# Patient Record
Sex: Female | Born: 1935 | Race: White | Hispanic: No | Smoking: Former smoker
Health system: Southern US, Community
[De-identification: ages and names within clinical notes are randomized; demographics above are authoritative.]

## PROBLEM LIST (undated history)

## (undated) DIAGNOSIS — M8580 Other specified disorders of bone density and structure, unspecified site: Secondary | ICD-10-CM

## (undated) DIAGNOSIS — E785 Hyperlipidemia, unspecified: Secondary | ICD-10-CM

## (undated) DIAGNOSIS — K219 Gastro-esophageal reflux disease without esophagitis: Secondary | ICD-10-CM

## (undated) DIAGNOSIS — F419 Anxiety disorder, unspecified: Secondary | ICD-10-CM

## (undated) DIAGNOSIS — M1990 Unspecified osteoarthritis, unspecified site: Secondary | ICD-10-CM

## (undated) DIAGNOSIS — S199XXA Unspecified injury of neck, initial encounter: Secondary | ICD-10-CM

## (undated) DIAGNOSIS — G709 Myoneural disorder, unspecified: Secondary | ICD-10-CM

## (undated) DIAGNOSIS — I10 Essential (primary) hypertension: Secondary | ICD-10-CM

## (undated) DIAGNOSIS — E079 Disorder of thyroid, unspecified: Secondary | ICD-10-CM

## (undated) HISTORY — PX: BREAST LUMPECTOMY: SHX2

## (undated) HISTORY — DX: Unspecified injury of neck, initial encounter: S19.9XXA

## (undated) HISTORY — PX: TONSILLECTOMY AND ADENOIDECTOMY: SUR1326

## (undated) HISTORY — DX: Hyperlipidemia, unspecified: E78.5

## (undated) HISTORY — PX: POLYPECTOMY: SHX149

## (undated) HISTORY — PX: COLONOSCOPY: SHX174

## (undated) HISTORY — DX: Disorder of thyroid, unspecified: E07.9

## (undated) HISTORY — DX: Other specified disorders of bone density and structure, unspecified site: M85.80

## (undated) HISTORY — DX: Myoneural disorder, unspecified: G70.9

## (undated) HISTORY — DX: Gastro-esophageal reflux disease without esophagitis: K21.9

## (undated) HISTORY — DX: Unspecified osteoarthritis, unspecified site: M19.90

---

## 1969-10-10 HISTORY — PX: ABDOMINAL HYSTERECTOMY: SHX81

## 1998-01-28 ENCOUNTER — Encounter: Admission: RE | Admit: 1998-01-28 | Discharge: 1998-04-28 | Payer: Self-pay | Admitting: Family Medicine

## 1998-08-17 ENCOUNTER — Other Ambulatory Visit: Admission: RE | Admit: 1998-08-17 | Discharge: 1998-08-17 | Payer: Self-pay | Admitting: Family Medicine

## 1999-10-08 ENCOUNTER — Other Ambulatory Visit: Admission: RE | Admit: 1999-10-08 | Discharge: 1999-10-08 | Payer: Self-pay | Admitting: Family Medicine

## 2000-10-09 ENCOUNTER — Other Ambulatory Visit: Admission: RE | Admit: 2000-10-09 | Discharge: 2000-10-09 | Payer: Self-pay | Admitting: Family Medicine

## 2002-10-31 ENCOUNTER — Other Ambulatory Visit: Admission: RE | Admit: 2002-10-31 | Discharge: 2002-10-31 | Payer: Self-pay | Admitting: Family Medicine

## 2003-08-01 ENCOUNTER — Encounter: Payer: Self-pay | Admitting: Family Medicine

## 2003-08-01 ENCOUNTER — Encounter: Admission: RE | Admit: 2003-08-01 | Discharge: 2003-08-01 | Payer: Self-pay | Admitting: Family Medicine

## 2004-03-01 ENCOUNTER — Encounter: Admission: RE | Admit: 2004-03-01 | Discharge: 2004-03-01 | Payer: Self-pay | Admitting: Family Medicine

## 2004-10-15 IMAGING — CT CT ABDOMEN W/ CM
1 series · 16 of 32 positions shown, 20 images · IV contrast (GASTRO & OMNIPAQUE 100)
Comparison: none

CLINICAL DATA: Right lower quadrant pain.  
CT SCAN OF THE ABDOMEN AND PELVIS WITH CONTRAST
Multidetector helical imaging carried out through the abdomen and pelvis utilizing oral and IV contrast (100 cc Omnipaque 300).  No prior studies for comparison. 
CT ABDOMEN WITH CONTRAST 
Lung bases clear.  Liver normal except for a single lucency in the periphery of the right lobe on image number 19.  This is likely a small cyst.  No calcified gallstones or biliary dilatation.  The spleen, pancreas, and adrenals normal.  The kidneys are normal.  No adenopathy or ascites. 
IMPRESSION
Unremarkable except for a single subcentimeter lucency in the liver- likely small cyst.
CT PELVIS 
The appendix is well visualized and is normal.  No evidence for diverticulitis or ileitis.  No focal masses, adenopathy, or fluid collections. The patient appears to have had a hysterectomy.

[Series 2: routine abdomen · axial · 0.70mm/px · z∈[-383,-3]mm · 16 of 114 slices shown, 20 images]
[im 8/114  soft-tissue]
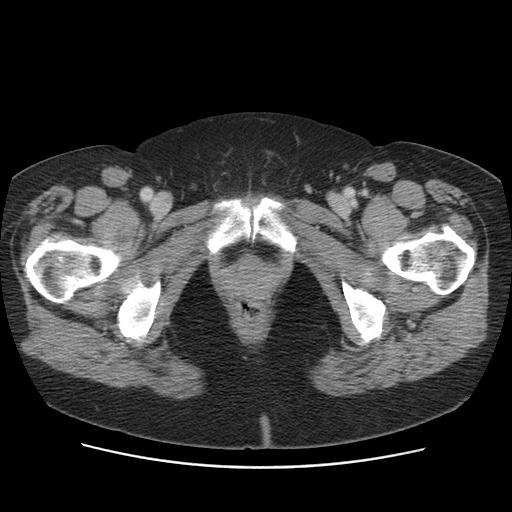
[im 8/114  bone]
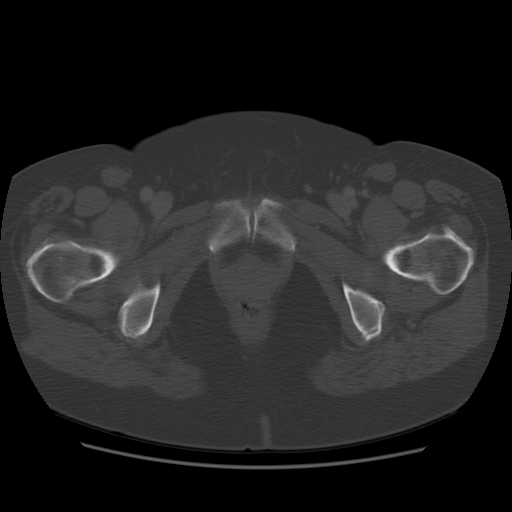
[im 15/114  soft-tissue]
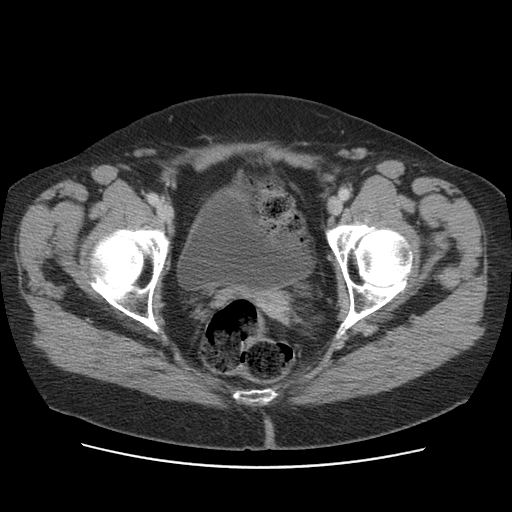
[im 22/114  soft-tissue]
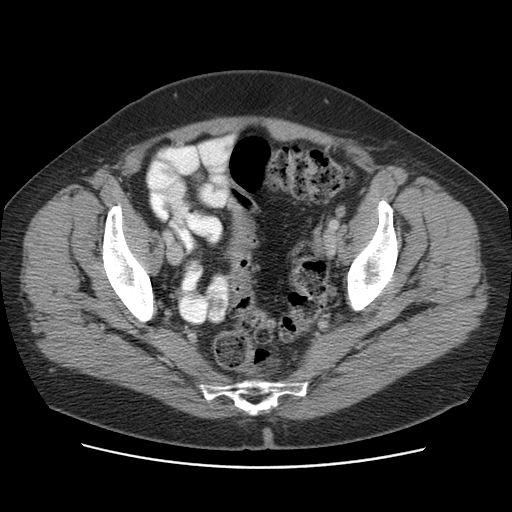
[im 30/114  soft-tissue]
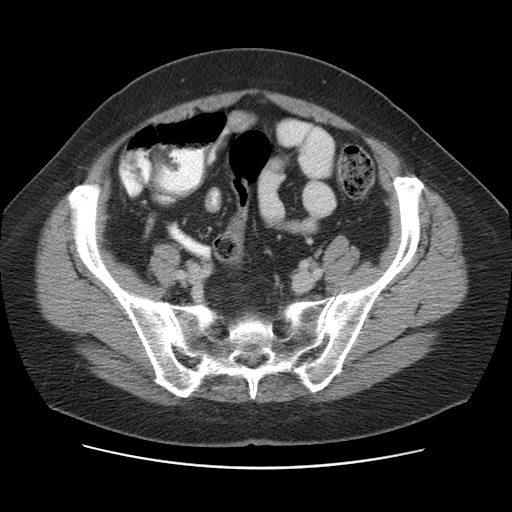
[im 37/114  soft-tissue]
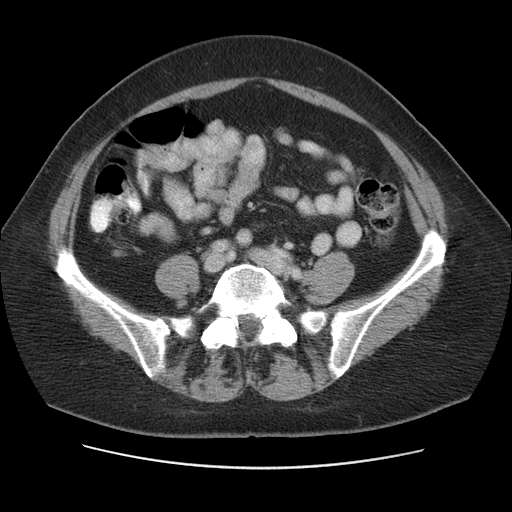
[im 44/114  soft-tissue]
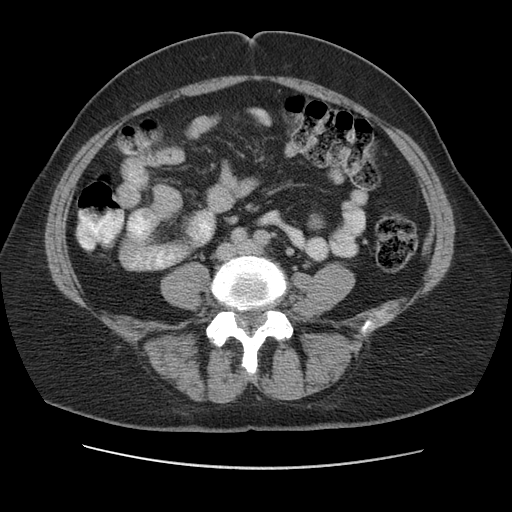
[im 52/114  soft-tissue]
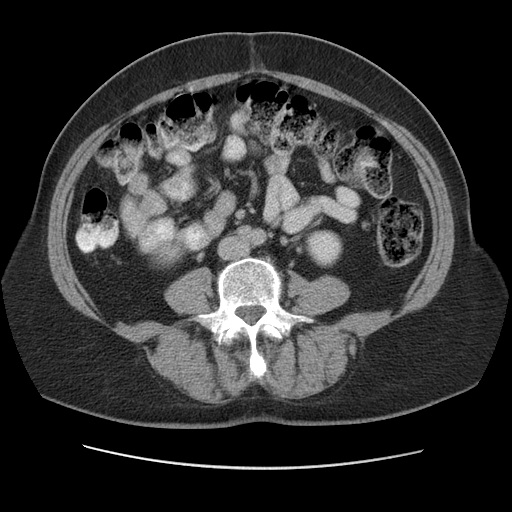
[im 62/114  soft-tissue]
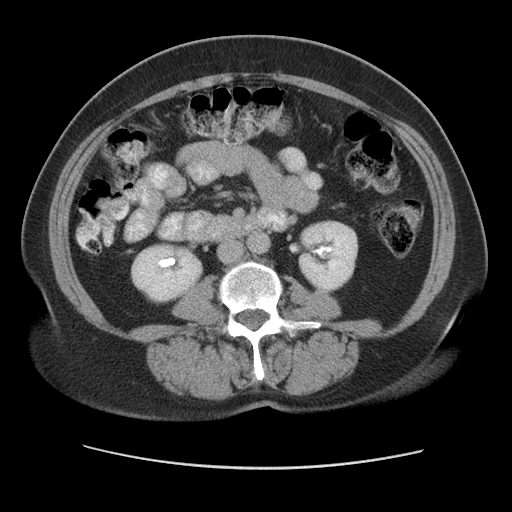
[im 70/114  soft-tissue]
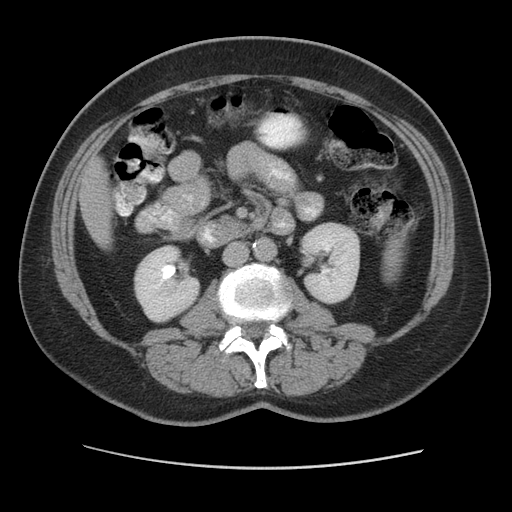
[im 70/114  bone]
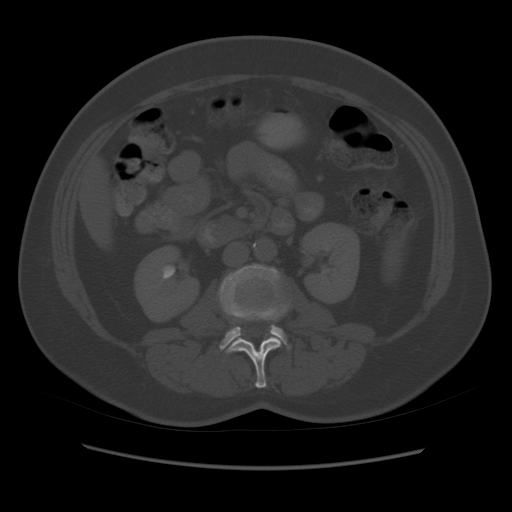
[im 77/114  soft-tissue]
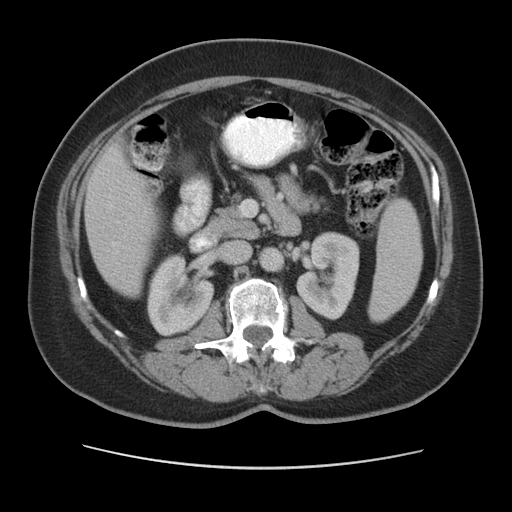
[im 84/114  soft-tissue]
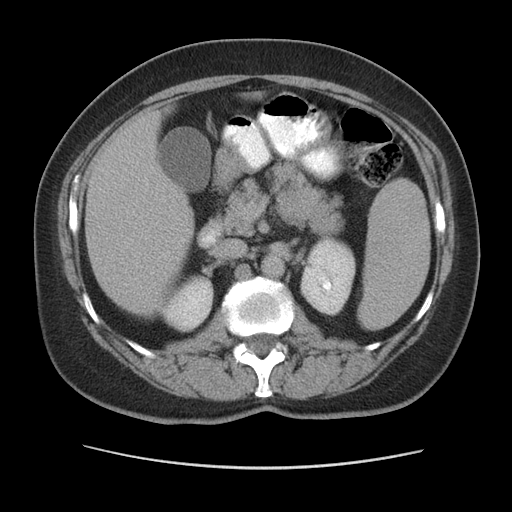
[im 92/114  soft-tissue]
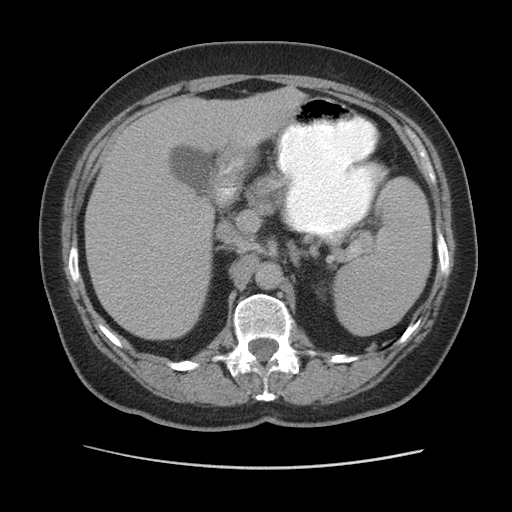
[im 99/114  soft-tissue]
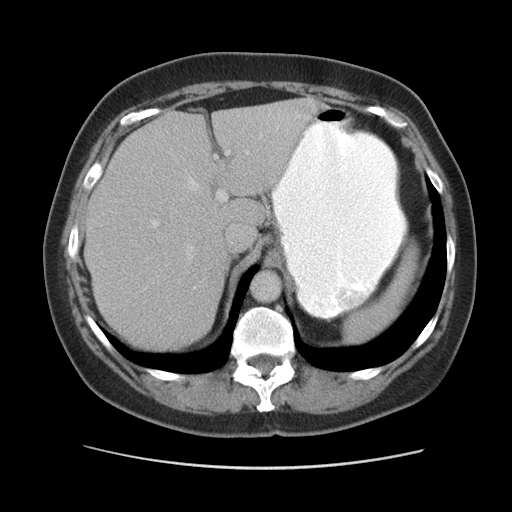
[im 99/114  lung]
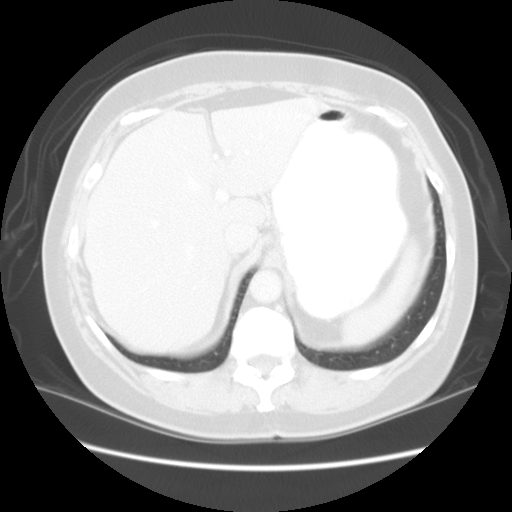
[im 103/114  lung]
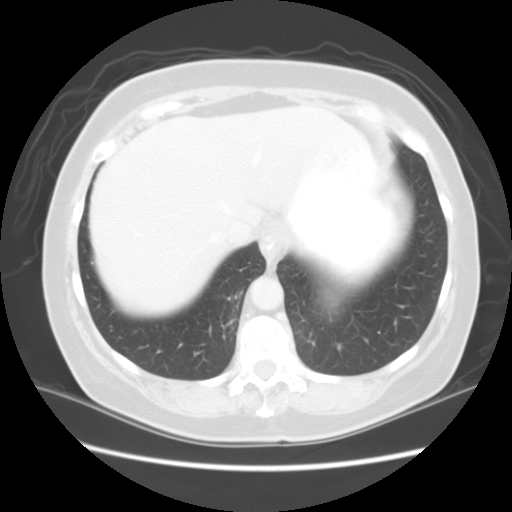
[im 106/114  soft-tissue]
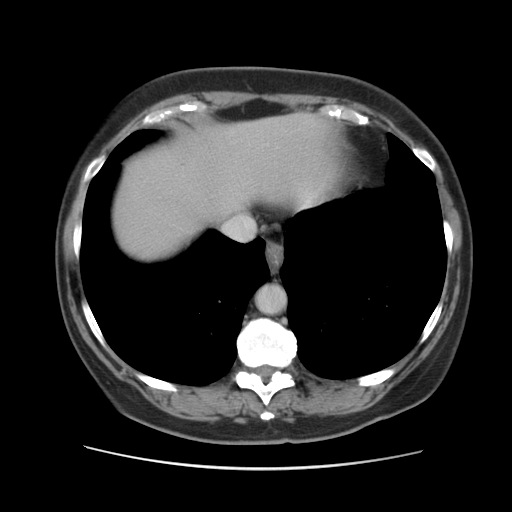
[im 106/114  lung]
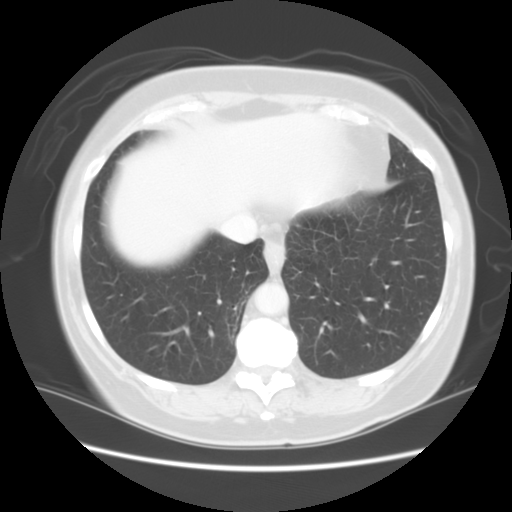
[im 110/114  lung]
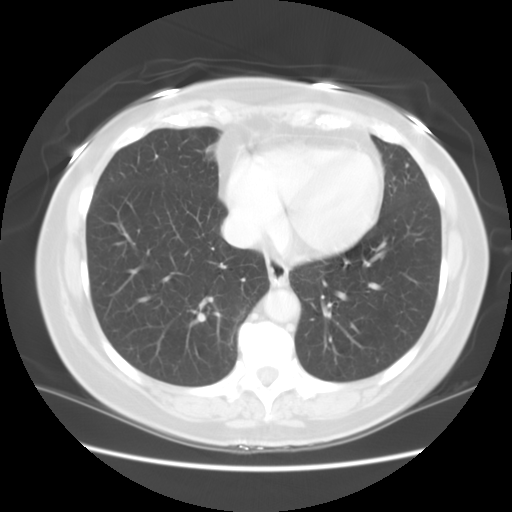

[16 of 32 positions shown; findings below may reference images not displayed]

FINDINGS: No pathological findings ? the appendix is well visualized and appears normal by CT criteria.

## 2007-01-12 ENCOUNTER — Encounter: Admission: RE | Admit: 2007-01-12 | Discharge: 2007-01-12 | Payer: Self-pay | Admitting: Family Medicine

## 2008-07-28 ENCOUNTER — Ambulatory Visit: Payer: Self-pay | Admitting: Gastroenterology

## 2008-08-06 ENCOUNTER — Telehealth: Payer: Self-pay | Admitting: Gastroenterology

## 2008-08-07 ENCOUNTER — Encounter: Payer: Self-pay | Admitting: Gastroenterology

## 2008-08-07 ENCOUNTER — Ambulatory Visit: Payer: Self-pay | Admitting: Gastroenterology

## 2008-08-08 ENCOUNTER — Encounter: Payer: Self-pay | Admitting: Gastroenterology

## 2008-08-08 ENCOUNTER — Telehealth: Payer: Self-pay | Admitting: Gastroenterology

## 2008-10-20 ENCOUNTER — Encounter: Admission: RE | Admit: 2008-10-20 | Discharge: 2008-10-20 | Payer: Self-pay | Admitting: Family Medicine

## 2010-08-24 ENCOUNTER — Emergency Department (HOSPITAL_COMMUNITY): Admission: EM | Admit: 2010-08-24 | Discharge: 2010-08-25 | Payer: Self-pay | Admitting: Emergency Medicine

## 2010-12-21 LAB — CBC
HCT: 34.4 % — ABNORMAL LOW (ref 36.0–46.0)
MCH: 31 pg (ref 26.0–34.0)
MCHC: 34.1 g/dL (ref 30.0–36.0)
MCV: 90.7 fL (ref 78.0–100.0)
RDW: 12.7 % (ref 11.5–15.5)
WBC: 4.9 10*3/uL (ref 4.0–10.5)

## 2010-12-21 LAB — POCT I-STAT, CHEM 8
BUN: 11 mg/dL (ref 6–23)
Calcium, Ion: 1.11 mmol/L — ABNORMAL LOW (ref 1.12–1.32)
Hemoglobin: 12.2 g/dL (ref 12.0–15.0)
TCO2: 28 mmol/L (ref 0–100)

## 2010-12-21 LAB — DIFFERENTIAL
Basophils Absolute: 0 10*3/uL (ref 0.0–0.1)
Eosinophils Absolute: 0.1 10*3/uL (ref 0.0–0.7)
Eosinophils Relative: 2 % (ref 0–5)
Lymphocytes Relative: 28 % (ref 12–46)
Monocytes Absolute: 0.6 10*3/uL (ref 0.1–1.0)

## 2010-12-21 LAB — URINALYSIS, ROUTINE W REFLEX MICROSCOPIC
Bilirubin Urine: NEGATIVE
Glucose, UA: NEGATIVE mg/dL
Hgb urine dipstick: NEGATIVE
Ketones, ur: NEGATIVE mg/dL
Protein, ur: NEGATIVE mg/dL
Urobilinogen, UA: 0.2 mg/dL (ref 0.0–1.0)

## 2011-08-19 ENCOUNTER — Encounter: Payer: Self-pay | Admitting: Gastroenterology

## 2011-11-03 ENCOUNTER — Encounter: Payer: Self-pay | Admitting: Gastroenterology

## 2011-11-08 ENCOUNTER — Ambulatory Visit (AMBULATORY_SURGERY_CENTER): Payer: 59 | Admitting: *Deleted

## 2011-11-08 VITALS — Ht 65.0 in | Wt 177.0 lb

## 2011-11-08 DIAGNOSIS — Z1211 Encounter for screening for malignant neoplasm of colon: Secondary | ICD-10-CM

## 2011-11-08 MED ORDER — PEG-KCL-NACL-NASULF-NA ASC-C 100 G PO SOLR: ORAL | Status: DC

## 2011-11-21 ENCOUNTER — Encounter: Payer: Self-pay | Admitting: Gastroenterology

## 2011-12-13 ENCOUNTER — Encounter: Payer: Self-pay | Admitting: Gastroenterology

## 2011-12-13 ENCOUNTER — Ambulatory Visit (AMBULATORY_SURGERY_CENTER): Payer: 59 | Admitting: Gastroenterology

## 2011-12-13 VITALS — BP 192/96 | HR 104 | Temp 96.2°F | Resp 15 | Ht 65.0 in | Wt 177.0 lb

## 2011-12-13 DIAGNOSIS — D126 Benign neoplasm of colon, unspecified: Secondary | ICD-10-CM

## 2011-12-13 DIAGNOSIS — K5730 Diverticulosis of large intestine without perforation or abscess without bleeding: Secondary | ICD-10-CM

## 2011-12-13 DIAGNOSIS — Z1211 Encounter for screening for malignant neoplasm of colon: Secondary | ICD-10-CM

## 2011-12-13 MED ORDER — SODIUM CHLORIDE 0.9 % IV SOLN: 500.0000 mL | INTRAVENOUS | Status: DC

## 2011-12-13 NOTE — Progress Notes (Signed)
Patient did not experience any of the following events: a burn prior to discharge; a fall within the facility; wrong site/side/patient/procedure/implant event; or a hospital transfer or hospital admission upon discharge from the facility. (G8907) Patient did not have preoperative order for IV antibiotic SSI prophylaxis. (G8918)  

## 2011-12-13 NOTE — Patient Instructions (Signed)
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
## 2011-12-13 NOTE — Op Note (Signed)
[facility] [address] [place], [place]  [address]  COLONOSCOPY PROCEDURE REPORT  PATIENT:  Michelle, [name]  MR#:  [id number] BIRTHDATE:  [birth year], 80 yrs. old  GENDER:  female ENDOSCOPIST:  Barbette Hair. Arlyce Dice, MD REF. BY:  Vernie Ammons, M.D. PROCEDURE DATE:  12/13/2011 PROCEDURE:  Colonoscopy with snare polypectomy, Colon with cold biopsy polypectomy ASA CLASS:  Class II INDICATIONS:  Screening, history of pre-cancerous (adenomatous) colon polyps 2012 - multiple right colon polyps MEDICATIONS:   MAC sedation, administered by CRNA propofol 200mg IV  DESCRIPTION OF PROCEDURE:   After the risks benefits and alternatives of the procedure were thoroughly explained, informed consent was obtained.  Digital rectal exam was performed and revealed no abnormalities.   The LB 180AL [id number] endoscope was introduced through the anus and advanced to the cecum, which was identified by both the appendix and ileocecal valve, without limitations.  The quality of the prep was excellent, using MoviPrep.  The instrument was then slowly withdrawn as the colon was fully examined. <<PROCEDUREIMAGES>>  FINDINGS:  Two polyps were found. 1 and 4mm sessile polyps in cecum and ascending colon, respectively, removed with cold bx forceps and cold polypectomy snare, respectively (see image4 and image6).  Severe diverticulosis was found in the sigmoid colon (see image8).  Scattered diverticula were found. transverse colon to cecum  This was otherwise a normal examination of the colon (see image2, image3, and image9).   Retroflexed views in the rectum revealed no abnormalities.    The time to cecum =  1) 6.0 minutes. The scope was then withdrawn in  1) 7.25  minutes from the cecum and the procedure completed. COMPLICATIONS:  None ENDOSCOPIC IMPRESSION: 1) Two polyps ascending colon 2) Severe diverticulosis in the sigmoid colon 3) Diverticula, scattered 4) Otherwise normal  examination RECOMMENDATIONS: 1) Given your age, you will not need another colonoscopy for colon cancer screening or polyp surveillance. These types of tests usually stop around the age 58. REPEAT EXAM:  No  ______________________________ Barbette Hair. Arlyce Dice, MD  CC:  n. eSIGNED:   Barbette Hair. Aurelia Gras at 12/13/2011 12:27 PM  Tawana Scale, [phone]

## 2011-12-14 ENCOUNTER — Telehealth: Payer: Self-pay | Admitting: *Deleted

## 2011-12-14 NOTE — Telephone Encounter (Signed)
  Follow up Call-  Call back number 12/13/2011  Post procedure Call Back phone  # [phone]  Permission to leave phone message No  comments no answering machine     Patient questions:  Do you have a fever, pain , or abdominal swelling? no Pain Score  0 *  Have you tolerated food without any problems? yes  Have you been able to return to your normal activities? yes  Do you have any questions about your discharge instructions: Diet   no Medications  no Follow up visit  no  Do you have questions or concerns about your Care? no  Actions: * If pain score is 4 or above: No action needed, pain <4.

## 2011-12-19 ENCOUNTER — Encounter: Payer: Self-pay | Admitting: Gastroenterology

## 2015-08-13 ENCOUNTER — Encounter: Payer: Self-pay | Admitting: Gastroenterology

## 2017-05-29 ENCOUNTER — Encounter (HOSPITAL_COMMUNITY): Payer: Self-pay

## 2017-05-29 ENCOUNTER — Emergency Department (HOSPITAL_COMMUNITY)
Admission: EM | Admit: 2017-05-29 | Discharge: 2017-05-29 | Disposition: A | Discharge status: Home / Self Care | Payer: Medicare Other | Attending: Emergency Medicine | Admitting: Emergency Medicine

## 2017-05-29 DIAGNOSIS — Z87891 Personal history of nicotine dependence: Secondary | ICD-10-CM | POA: Diagnosis not present

## 2017-05-29 DIAGNOSIS — Z7982 Long term (current) use of aspirin: Secondary | ICD-10-CM | POA: Insufficient documentation

## 2017-05-29 DIAGNOSIS — I160 Hypertensive urgency: Secondary | ICD-10-CM | POA: Insufficient documentation

## 2017-05-29 DIAGNOSIS — Z79899 Other long term (current) drug therapy: Secondary | ICD-10-CM | POA: Diagnosis not present

## 2017-05-29 DIAGNOSIS — E039 Hypothyroidism, unspecified: Secondary | ICD-10-CM | POA: Insufficient documentation

## 2017-05-29 DIAGNOSIS — R42 Dizziness and giddiness: Secondary | ICD-10-CM | POA: Diagnosis present

## 2017-05-29 LAB — URINALYSIS, ROUTINE W REFLEX MICROSCOPIC
BILIRUBIN URINE: NEGATIVE
Glucose, UA: NEGATIVE mg/dL
HGB URINE DIPSTICK: NEGATIVE
Ketones, ur: NEGATIVE mg/dL
Leukocytes, UA: NEGATIVE
Nitrite: NEGATIVE
PROTEIN: NEGATIVE mg/dL
Specific Gravity, Urine: 1.003 — ABNORMAL LOW (ref 1.005–1.030)
pH: 8 (ref 5.0–8.0)

## 2017-05-29 LAB — CBC WITH DIFFERENTIAL/PLATELET
Basophils Absolute: 0 10*3/uL (ref 0.0–0.1)
Basophils Relative: 0 %
EOS ABS: 0.1 10*3/uL (ref 0.0–0.7)
EOS PCT: 2 %
HCT: 43.1 % (ref 36.0–46.0)
Hemoglobin: 14.1 g/dL (ref 12.0–15.0)
LYMPHS ABS: 1.6 10*3/uL (ref 0.7–4.0)
Lymphocytes Relative: 34 %
MCH: 30 pg (ref 26.0–34.0)
MCHC: 32.7 g/dL (ref 30.0–36.0)
MCV: 91.7 fL (ref 78.0–100.0)
Monocytes Absolute: 0.4 10*3/uL (ref 0.1–1.0)
Monocytes Relative: 10 %
Neutro Abs: 2.5 10*3/uL (ref 1.7–7.7)
Neutrophils Relative %: 54 %
PLATELETS: 122 10*3/uL — AB (ref 150–400)
RBC: 4.7 MIL/uL (ref 3.87–5.11)
RDW: 12.7 % (ref 11.5–15.5)
WBC: 4.6 10*3/uL (ref 4.0–10.5)

## 2017-05-29 LAB — BASIC METABOLIC PANEL
Anion gap: 9 (ref 5–15)
BUN: 11 mg/dL (ref 6–20)
CALCIUM: 9.3 mg/dL (ref 8.9–10.3)
CHLORIDE: 106 mmol/L (ref 101–111)
CO2: 26 mmol/L (ref 22–32)
CREATININE: 0.77 mg/dL (ref 0.44–1.00)
GFR calc Af Amer: >60 mL/min (ref >60)
Glucose, Bld: 105 mg/dL — ABNORMAL HIGH (ref 65–99)
Potassium: 4.5 mmol/L (ref 3.5–5.1)
SODIUM: 141 mmol/L (ref 135–145)

## 2017-05-29 LAB — TSH: TSH: 1.617 u[IU]/mL (ref 0.350–4.500)

## 2017-05-29 LAB — T4, FREE: Free T4: 1.22 ng/dL — ABNORMAL HIGH (ref 0.61–1.12)

## 2017-05-29 MED ORDER — HYDROCHLOROTHIAZIDE 12.5 MG PO TABS: 12.5000 mg | ORAL_TABLET | Freq: Every day | ORAL | 0 refills | Status: DC
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
## 2017-05-29 NOTE — ED Triage Notes (Signed)
Pt from [facility] with high blood pressure. 210/90

## 2017-05-29 NOTE — Discharge Instructions (Signed)
As discussed, your evaluation today has been largely reassuring.  But, it is important that you monitor your condition carefully, and do not hesitate to return to the ED if you develop new, or concerning changes in your condition. ? ?Otherwise, please follow-up with your physician for appropriate ongoing care. ? ?

## 2017-05-30 ENCOUNTER — Emergency Department (HOSPITAL_COMMUNITY)
Admission: EM | Admit: 2017-05-30 | Discharge: 2017-05-30 | Disposition: A | Discharge status: Home / Self Care | Payer: Medicare Other | Attending: Emergency Medicine | Admitting: Emergency Medicine

## 2017-05-30 ENCOUNTER — Encounter (HOSPITAL_COMMUNITY): Payer: Self-pay | Admitting: Emergency Medicine

## 2017-05-30 DIAGNOSIS — E039 Hypothyroidism, unspecified: Secondary | ICD-10-CM | POA: Diagnosis not present

## 2017-05-30 DIAGNOSIS — Z7982 Long term (current) use of aspirin: Secondary | ICD-10-CM | POA: Insufficient documentation

## 2017-05-30 DIAGNOSIS — Z79899 Other long term (current) drug therapy: Secondary | ICD-10-CM | POA: Insufficient documentation

## 2017-05-30 DIAGNOSIS — Z87891 Personal history of nicotine dependence: Secondary | ICD-10-CM | POA: Insufficient documentation

## 2017-05-30 DIAGNOSIS — R42 Dizziness and giddiness: Secondary | ICD-10-CM | POA: Insufficient documentation

## 2017-05-30 DIAGNOSIS — I10 Essential (primary) hypertension: Secondary | ICD-10-CM | POA: Diagnosis not present

## 2017-05-30 MED ORDER — MECLIZINE HCL 12.5 MG PO TABS: 12.5000 mg | ORAL_TABLET | Freq: Three times a day (TID) | ORAL | 0 refills | Status: DC | PRN

## 2017-05-30 MED ORDER — HYDROCHLOROTHIAZIDE 25 MG PO TABS: 25.0000 mg | ORAL_TABLET | Freq: Every day | ORAL | 0 refills | Status: DC

## 2017-05-30 NOTE — ED Triage Notes (Addendum)
Pt in from [facility] via [facility] with HTN, dizziness and near syncope at 1510 while resting this afternoon. Came here yesterday for HTN, BP was 240/90 for EMS en route today. Pt denies pain, HA, n/v. Pt states she started new BP med yesterday. MAE's equally, CBG 122, sats 95% RA, a&ox4
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
## 2017-05-30 NOTE — ED Notes (Signed)
Attempted to call [facility]. Will continue to call to give report.

## 2018-02-12 ENCOUNTER — Emergency Department (HOSPITAL_COMMUNITY): Payer: Medicare Other

## 2018-02-12 ENCOUNTER — Emergency Department (HOSPITAL_COMMUNITY)
Admission: EM | Admit: 2018-02-12 | Discharge: 2018-02-12 | Disposition: A | Discharge status: Home / Self Care | Payer: Medicare Other | Attending: Emergency Medicine | Admitting: Emergency Medicine

## 2018-02-12 ENCOUNTER — Other Ambulatory Visit: Payer: Self-pay

## 2018-02-12 ENCOUNTER — Encounter (HOSPITAL_COMMUNITY): Payer: Self-pay | Admitting: Obstetrics and Gynecology

## 2018-02-12 DIAGNOSIS — W1830XA Fall on same level, unspecified, initial encounter: Secondary | ICD-10-CM | POA: Insufficient documentation

## 2018-02-12 DIAGNOSIS — X500XXA Overexertion from strenuous movement or load, initial encounter: Secondary | ICD-10-CM | POA: Diagnosis not present

## 2018-02-12 DIAGNOSIS — Y999 Unspecified external cause status: Secondary | ICD-10-CM | POA: Insufficient documentation

## 2018-02-12 DIAGNOSIS — Z79899 Other long term (current) drug therapy: Secondary | ICD-10-CM | POA: Insufficient documentation

## 2018-02-12 DIAGNOSIS — Y9389 Activity, other specified: Secondary | ICD-10-CM | POA: Diagnosis not present

## 2018-02-12 DIAGNOSIS — Y92099 Unspecified place in other non-institutional residence as the place of occurrence of the external cause: Secondary | ICD-10-CM | POA: Diagnosis not present

## 2018-02-12 DIAGNOSIS — S99912A Unspecified injury of left ankle, initial encounter: Secondary | ICD-10-CM | POA: Diagnosis present

## 2018-02-12 DIAGNOSIS — I10 Essential (primary) hypertension: Secondary | ICD-10-CM | POA: Diagnosis not present

## 2018-02-12 DIAGNOSIS — M25572 Pain in left ankle and joints of left foot: Secondary | ICD-10-CM | POA: Diagnosis not present

## 2018-02-12 HISTORY — DX: Essential (primary) hypertension: I10

## 2018-02-12 MED ORDER — ACETAMINOPHEN 500 MG PO TABS: 500.0000 mg | ORAL_TABLET | Freq: Four times a day (QID) | ORAL | 0 refills | Status: DC | PRN

## 2018-02-12 NOTE — ED Notes (Signed)
Bed: WTR5 Expected date:  Expected time:  Means of arrival:  Comments: EMS-ankle pain/twisted
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
## 2018-02-12 NOTE — Discharge Instructions (Signed)
Take Tylenol every 6 hours for pain. Keep your leg elevated. Apply ice to your ankle as frequently as possible. Use the walking boot and your walker to get around. Follow-up with orthopedic doctor for further evaluation of your ankle. Return to the emergency room if you cannot feel your foot, pain worsens significantly, or any new or concerning symptoms.

## 2018-02-12 NOTE — ED Triage Notes (Signed)
Pt coming from home BIB EMS.Pt has bruising on the ankle. PT did not fall or hit her head. No LOC reported.  Pt was standing from her walker and stood up from her seat and twisted her ankle. Pt currently has ice on it. Pt has a hx of hypertension.

## 2018-02-12 NOTE — ED Notes (Signed)
RN called [facility] who informed RN that they have cab vouchers for [employer] to get patients back. RN double checked and told the taxi personnel that cab voucher would be provided from [facility] and the taxi driver was given the number. Pt was not appropriate to go back by [facility] as she was able to walk with her Cam walker, had no AMS, and had no oxygen dependencies.  Representative from [place] was made aware of the impending discharge and aware pt is coming and cab voucher expected upon arrival.

## 2018-04-30 ENCOUNTER — Emergency Department (HOSPITAL_COMMUNITY)
Admission: EM | Admit: 2018-04-30 | Discharge: 2018-04-30 | Disposition: A | Discharge status: Home / Self Care | Payer: Medicare Other | Attending: Emergency Medicine | Admitting: Emergency Medicine

## 2018-04-30 ENCOUNTER — Encounter (HOSPITAL_COMMUNITY): Payer: Self-pay

## 2018-04-30 DIAGNOSIS — Z79899 Other long term (current) drug therapy: Secondary | ICD-10-CM | POA: Insufficient documentation

## 2018-04-30 DIAGNOSIS — Z87891 Personal history of nicotine dependence: Secondary | ICD-10-CM | POA: Insufficient documentation

## 2018-04-30 DIAGNOSIS — Z7982 Long term (current) use of aspirin: Secondary | ICD-10-CM | POA: Diagnosis not present

## 2018-04-30 DIAGNOSIS — E039 Hypothyroidism, unspecified: Secondary | ICD-10-CM | POA: Diagnosis not present

## 2018-04-30 DIAGNOSIS — R319 Hematuria, unspecified: Secondary | ICD-10-CM | POA: Diagnosis present

## 2018-04-30 DIAGNOSIS — I10 Essential (primary) hypertension: Secondary | ICD-10-CM | POA: Insufficient documentation

## 2018-04-30 DIAGNOSIS — N3001 Acute cystitis with hematuria: Secondary | ICD-10-CM | POA: Diagnosis not present

## 2018-04-30 LAB — URINALYSIS, ROUTINE W REFLEX MICROSCOPIC
BILIRUBIN URINE: NEGATIVE
Glucose, UA: NEGATIVE mg/dL
Ketones, ur: NEGATIVE mg/dL
NITRITE: NEGATIVE
PH: 7 (ref 5.0–8.0)
Protein, ur: 100 mg/dL — AB
SPECIFIC GRAVITY, URINE: 1.016 (ref 1.005–1.030)

## 2018-04-30 MED ORDER — PHENAZOPYRIDINE HCL 200 MG PO TABS: 200.0000 mg | ORAL_TABLET | Freq: Three times a day (TID) | ORAL | 0 refills | Status: DC

## 2018-04-30 MED ORDER — CEPHALEXIN 500 MG PO CAPS
500.0000 mg | ORAL_CAPSULE | Freq: Once | ORAL | Status: AC
  Administered 2018-04-30: 500 mg via ORAL
  Filled 2018-04-30: qty 1

## 2018-04-30 MED ORDER — CEPHALEXIN 500 MG PO CAPS: 500.0000 mg | ORAL_CAPSULE | Freq: Four times a day (QID) | ORAL | 0 refills | Status: DC

## 2018-04-30 MED ORDER — HYDROCHLOROTHIAZIDE 12.5 MG PO CAPS
25.0000 mg | ORAL_CAPSULE | Freq: Once | ORAL | Status: AC
  Administered 2018-04-30: 25 mg via ORAL
  Filled 2018-04-30: qty 2

## 2018-04-30 MED ORDER — HYDROCHLOROTHIAZIDE 25 MG PO TABS: 25.0000 mg | ORAL_TABLET | Freq: Every day | ORAL | Status: DC

## 2018-04-30 NOTE — ED Notes (Signed)
Bed: [facility] Expected date:  Expected time:  Means of arrival:  Comments: Hold for 22

## 2018-04-30 NOTE — ED Notes (Signed)
Bed: BS49 Expected date:  Expected time:  Means of arrival:  Comments: 84yo F hematuria

## 2018-04-30 NOTE — ED Triage Notes (Addendum)
Patient arrived via [facility] from [facility]. Patient voided this morning and when she wiped she had pink tint on toilet paper and few clots. Patient was initially hypertensive 210/100 and dropped to 150/100 on ride over to ED. Patient was anxious and feels this is why her bp was high initially. Patient states she does not feel her BP medication is working. Hx. Vertigo and anxiety.

## 2018-04-30 NOTE — ED Notes (Addendum)
Spoke with [facility]. Nurse was unavailable. Left a message asking RN to call back and that patient will be returning home in a taxi.

## 2018-04-30 NOTE — Discharge Instructions (Addendum)
It was our pleasure to provide your ER care today - we hope that you feel better.  Drink plenty of fluids. Take keflex (antibiotic) as prescribed. Take ibuprofen or acetaminophen as need.   Follow up with primary care doctor in 1 week if symptoms fail to improve/resolve. Also follow up with your doctor for recheck of blood pressure in the next 1-2 weeks.   Return to ER if worse, new symptoms, new or severe pain, persistent vomiting, other concern.
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
## 2018-05-02 LAB — URINE CULTURE: Culture: >=100000 — AB

## 2018-05-03 ENCOUNTER — Telehealth: Payer: Self-pay | Admitting: Emergency Medicine

## 2018-05-03 NOTE — Telephone Encounter (Signed)
Post ED Visit - Positive Culture Follow-up  Culture report reviewed by antimicrobial stewardship pharmacist:  []  Elenor Quinones, Pharm.D. []  Heide Guile, Pharm.D., BCPS AQ-ID []  Parks Neptune, Pharm.D., BCPS []  Alycia Rossetti, Pharm.D., BCPS []  Hester, Pharm.D., BCPS, AAHIVP []  Legrand Como, Pharm.D., BCPS, AAHIVP [x]  Salome Arnt, PharmD, BCPS []  Johnnette Gourd, PharmD, BCPS []  Hughes Better, PharmD, BCPS []  Leeroy Cha, PharmD  Positive urine culture Treated with cephalexin, organism sensitive to the same and no further patient follow-up is required at this time.  Hazle Nordmann 05/03/2018, 5:08 PM

## 2018-05-24 ENCOUNTER — Encounter (INDEPENDENT_AMBULATORY_CARE_PROVIDER_SITE_OTHER): Payer: Medicare Other | Admitting: Ophthalmology

## 2018-05-24 DIAGNOSIS — I10 Essential (primary) hypertension: Secondary | ICD-10-CM | POA: Diagnosis not present

## 2018-05-24 DIAGNOSIS — H43813 Vitreous degeneration, bilateral: Secondary | ICD-10-CM | POA: Diagnosis not present

## 2018-05-24 DIAGNOSIS — H35033 Hypertensive retinopathy, bilateral: Secondary | ICD-10-CM

## 2018-05-24 DIAGNOSIS — H353132 Nonexudative age-related macular degeneration, bilateral, intermediate dry stage: Secondary | ICD-10-CM | POA: Diagnosis not present

## 2018-09-28 IMAGING — CR DG ANKLE COMPLETE 3+V*L*
3 series · 3 of 3 positions shown · non-contrast
Comparison: None.

CLINICAL DATA: 87-year-old who injured the LEFT ankle when standing
up from her chair using her walker earlier this evening. Patient now
complains of hip and pelvis pain. Initial encounter.

EXAM:
LEFT ANKLE COMPLETE - 3+ VIEW

[x ankle ap left]
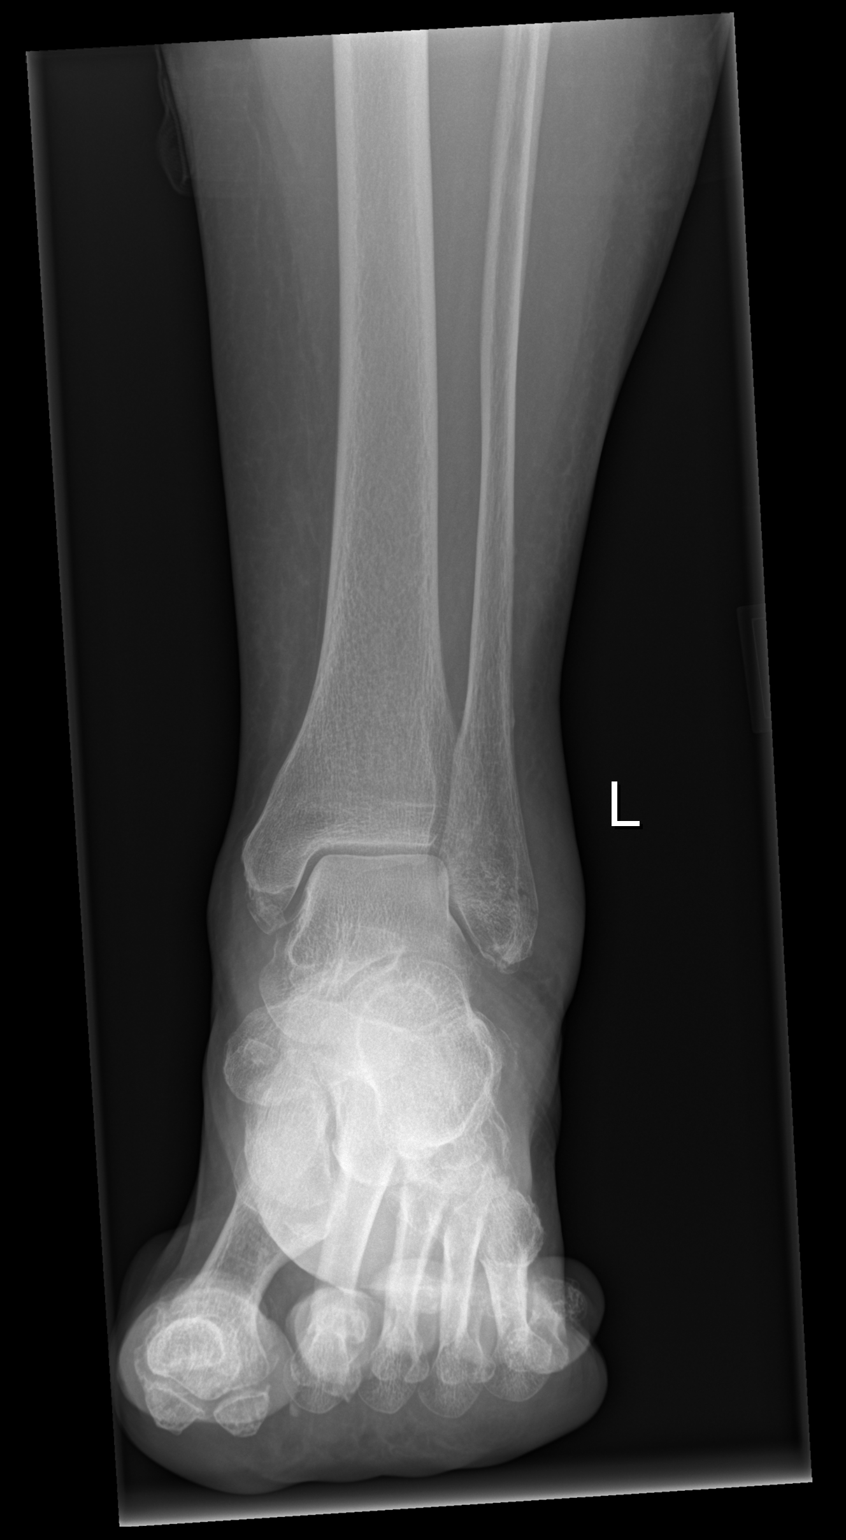

[x ankle obl left]
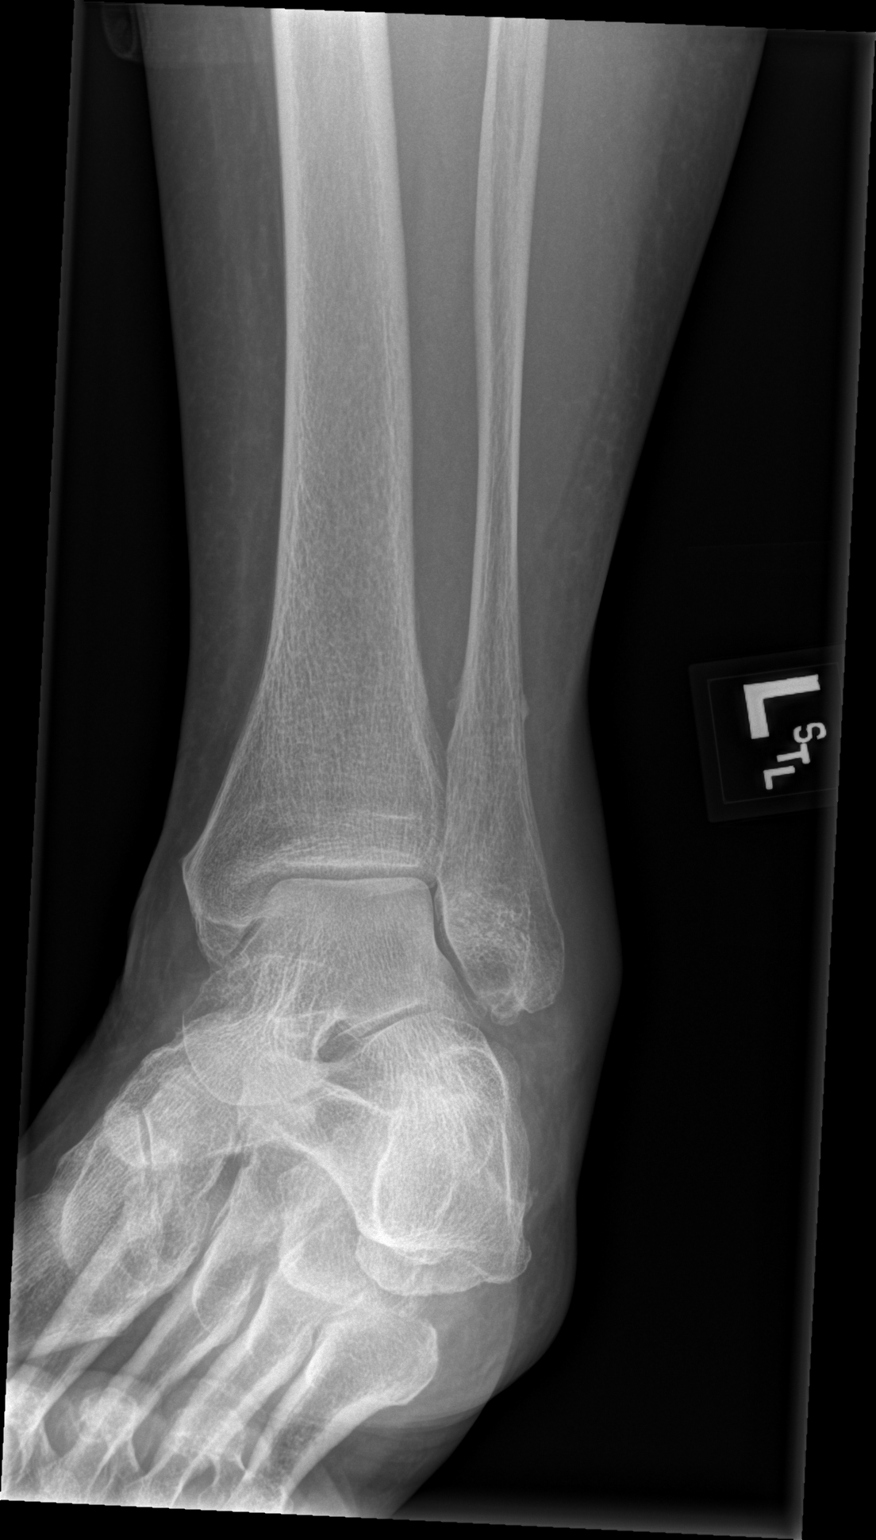

[x ankle lat left]
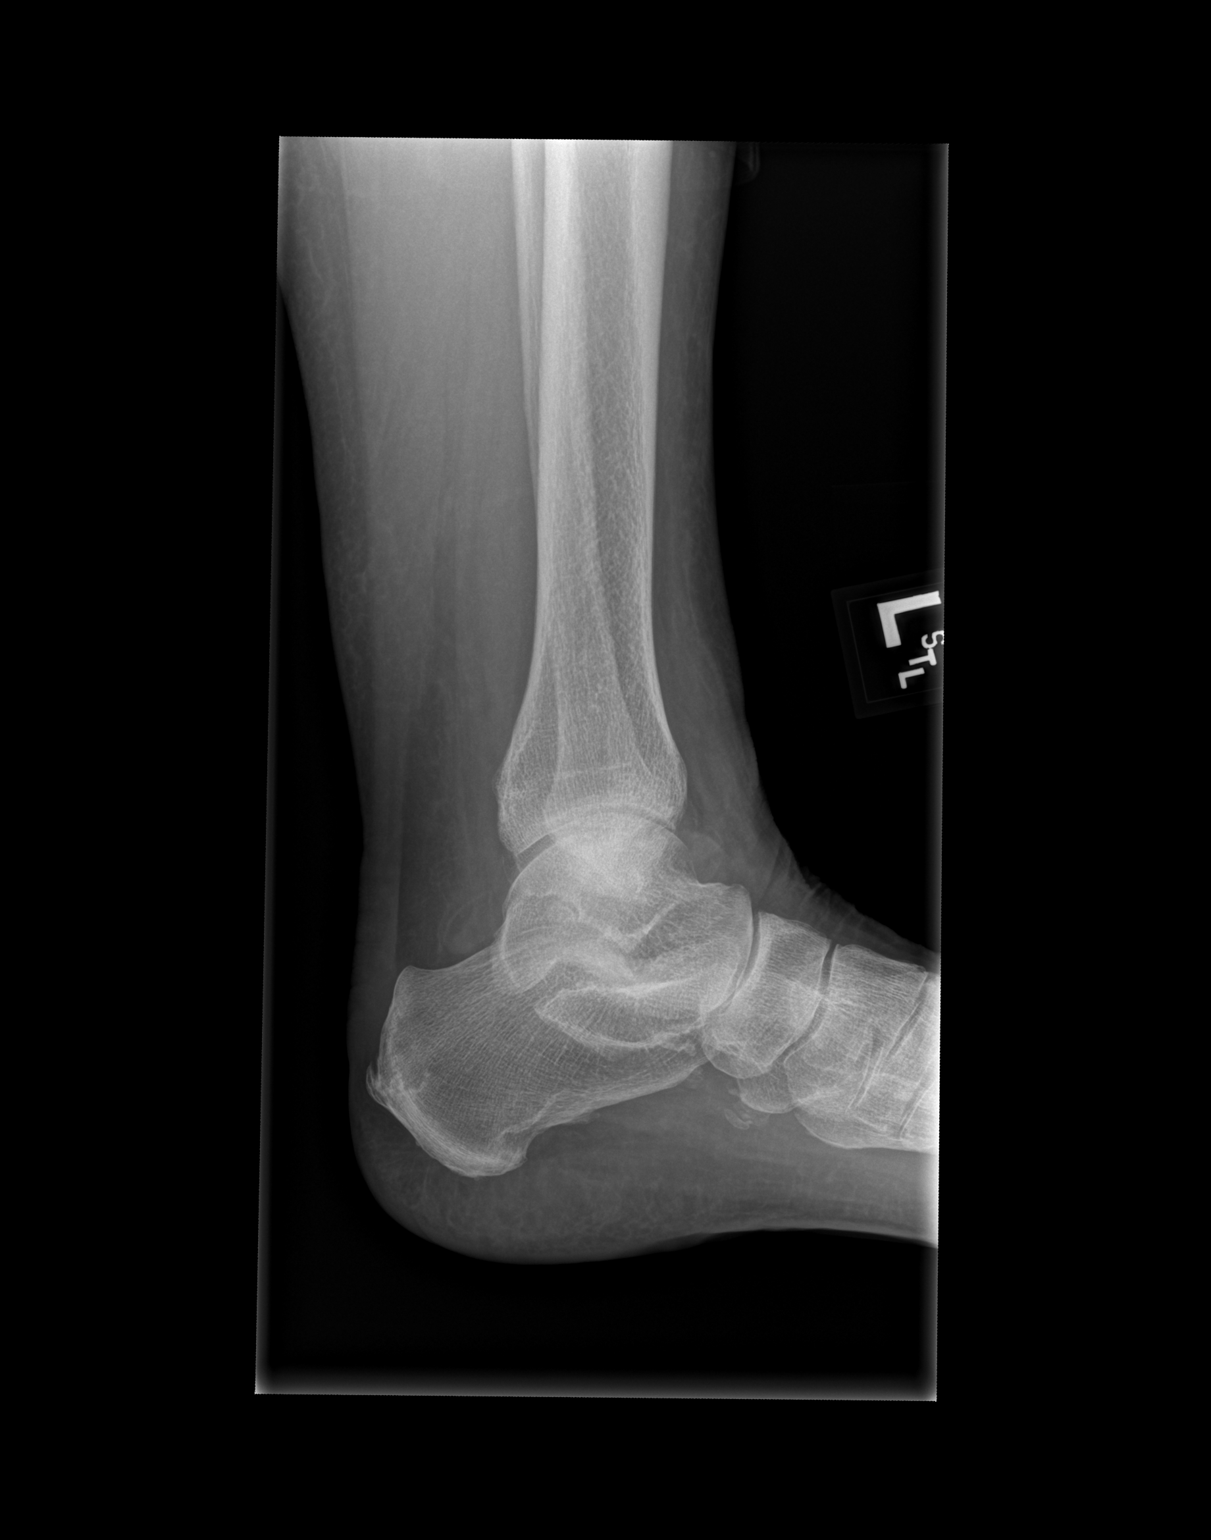

[3 of 3 positions shown; findings below may reference images not displayed]

FINDINGS: LATERAL soft tissue swelling. Possible avulsion fracture arising
from the LATERAL aspect of the calcaneus, visible only on the AP
image. No other fractures. Ankle mortise intact with mild joint
space narrowing. Large ankle joint effusion.
IMPRESSION: 1. Possible avulsion fracture arising from the LATERAL aspect of the
calcaneus. Please correlate with point tenderness.
2. No fractures elsewhere.
3. Osteoarthritis.

## 2020-01-15 ENCOUNTER — Other Ambulatory Visit: Payer: Self-pay | Admitting: Radiology

## 2020-01-17 ENCOUNTER — Encounter: Payer: Self-pay | Admitting: *Deleted

## 2020-01-23 ENCOUNTER — Encounter: Payer: Self-pay | Admitting: *Deleted

## 2020-01-23 DIAGNOSIS — Z170 Estrogen receptor positive status [ER+]: Secondary | ICD-10-CM | POA: Insufficient documentation

## 2020-01-23 DIAGNOSIS — C50212 Malignant neoplasm of upper-inner quadrant of left female breast: Secondary | ICD-10-CM | POA: Insufficient documentation
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
## 2020-01-29 ENCOUNTER — Inpatient Hospital Stay: Payer: Medicare Other | Attending: Oncology | Admitting: Oncology

## 2020-01-29 ENCOUNTER — Ambulatory Visit: Payer: Medicare Other | Attending: Surgery | Admitting: Physical Therapy

## 2020-01-29 ENCOUNTER — Encounter: Payer: Self-pay | Admitting: Licensed Clinical Social Worker

## 2020-01-29 ENCOUNTER — Other Ambulatory Visit: Payer: Self-pay | Admitting: *Deleted

## 2020-01-29 ENCOUNTER — Ambulatory Visit: Payer: Self-pay | Admitting: Surgery

## 2020-01-29 ENCOUNTER — Encounter: Payer: Self-pay | Admitting: Physical Therapy

## 2020-01-29 ENCOUNTER — Inpatient Hospital Stay: Payer: Medicare Other

## 2020-01-29 ENCOUNTER — Other Ambulatory Visit: Payer: Self-pay

## 2020-01-29 ENCOUNTER — Ambulatory Visit
Admission: RE | Admit: 2020-01-29 | Discharge: 2020-01-29 | Disposition: A | Discharge status: Home / Self Care | Payer: Medicare Other | Source: Ambulatory Visit | Attending: Radiation Oncology | Admitting: Radiation Oncology

## 2020-01-29 ENCOUNTER — Encounter: Payer: Self-pay | Admitting: *Deleted

## 2020-01-29 ENCOUNTER — Encounter: Payer: Self-pay | Admitting: Radiation Oncology

## 2020-01-29 VITALS — BP 163/70 | HR 79 | Temp 98.2°F | Resp 18 | Ht 65.0 in | Wt 162.2 lb

## 2020-01-29 DIAGNOSIS — C50212 Malignant neoplasm of upper-inner quadrant of left female breast: Secondary | ICD-10-CM | POA: Insufficient documentation

## 2020-01-29 DIAGNOSIS — R293 Abnormal posture: Secondary | ICD-10-CM | POA: Diagnosis present

## 2020-01-29 DIAGNOSIS — Z170 Estrogen receptor positive status [ER+]: Secondary | ICD-10-CM | POA: Diagnosis not present

## 2020-01-29 DIAGNOSIS — Z79899 Other long term (current) drug therapy: Secondary | ICD-10-CM

## 2020-01-29 DIAGNOSIS — C50912 Malignant neoplasm of unspecified site of left female breast: Secondary | ICD-10-CM

## 2020-01-29 DIAGNOSIS — Z87891 Personal history of nicotine dependence: Secondary | ICD-10-CM

## 2020-01-29 DIAGNOSIS — M1990 Unspecified osteoarthritis, unspecified site: Secondary | ICD-10-CM

## 2020-01-29 DIAGNOSIS — R410 Disorientation, unspecified: Secondary | ICD-10-CM | POA: Diagnosis not present

## 2020-01-29 LAB — CMP (CANCER CENTER ONLY)
ALT: 16 U/L (ref 0–44)
AST: 20 U/L (ref 15–41)
Albumin: 3.8 g/dL (ref 3.5–5.0)
Alkaline Phosphatase: 62 U/L (ref 38–126)
Anion gap: 9 (ref 5–15)
BUN: 11 mg/dL (ref 8–23)
CO2: 27 mmol/L (ref 22–32)
Calcium: 9.4 mg/dL (ref 8.9–10.3)
Chloride: 104 mmol/L (ref 98–111)
Creatinine: 0.93 mg/dL (ref 0.44–1.00)
GFR, Est AFR Am: >60 mL/min (ref >60)
GFR, Estimated: 55 mL/min — ABNORMAL LOW (ref >60)
Glucose, Bld: 119 mg/dL — ABNORMAL HIGH (ref 70–99)
Potassium: 4.1 mmol/L (ref 3.5–5.1)
Sodium: 140 mmol/L (ref 135–145)
Total Bilirubin: 0.5 mg/dL (ref 0.3–1.2)
Total Protein: 6.9 g/dL (ref 6.5–8.1)

## 2020-01-29 LAB — CBC WITH DIFFERENTIAL (CANCER CENTER ONLY)
Abs Immature Granulocytes: 0.01 10*3/uL (ref 0.00–0.07)
Basophils Absolute: 0 10*3/uL (ref 0.0–0.1)
Basophils Relative: 0 %
Eosinophils Absolute: 0.1 10*3/uL (ref 0.0–0.5)
Eosinophils Relative: 2 %
HCT: 43.3 % (ref 36.0–46.0)
Hemoglobin: 14 g/dL (ref 12.0–15.0)
Immature Granulocytes: 0 %
Lymphocytes Relative: 28 %
Lymphs Abs: 1.4 10*3/uL (ref 0.7–4.0)
MCH: 30.4 pg (ref 26.0–34.0)
MCHC: 32.3 g/dL (ref 30.0–36.0)
MCV: 94.1 fL (ref 80.0–100.0)
Monocytes Absolute: 0.5 10*3/uL (ref 0.1–1.0)
Monocytes Relative: 10 %
Neutro Abs: 3 10*3/uL (ref 1.7–7.7)
Neutrophils Relative %: 60 %
Platelet Count: 155 10*3/uL (ref 150–400)
RBC: 4.6 MIL/uL (ref 3.87–5.11)
RDW: 12.4 % (ref 11.5–15.5)
WBC Count: 5.1 10*3/uL (ref 4.0–10.5)
nRBC: 0 % (ref 0.0–0.2)

## 2020-01-29 LAB — GENETIC SCREENING ORDER

## 2020-01-29 NOTE — Progress Notes (Signed)
START OFF PATHWAY REGIMEN - Breast   [id number]:Trastuzumab (Maintenance - NO Loading Dose):   A cycle is every 21 days:     Trastuzumab-xxxx   **Always confirm dose/schedule in your pharmacy ordering system**  Patient Characteristics: Postoperative without Neoadjuvant Therapy (Pathologic Staging), Invasive Disease, Extended Adjuvant Therapy Indicated (Following Completion of Trastuzumab-based Adjuvant Therapy), HER2 Positive, ER Positive Therapeutic Status: Postoperative without Neoadjuvant Therapy (Pathologic Staging) AJCC Grade: GX AJCC N Category: pNX AJCC M Category: cM0 ER Status: Positive (+) AJCC 8 Stage Grouping: IIA HER2 Status: Positive (+) Oncotype Dx Recurrence Score: Not Appropriate AJCC T Category: pTX PR Status: Positive (+) Intent of Therapy: Curative Intent, Discussed with Patient

## 2020-01-29 NOTE — H&P (View-Only) (Signed)
Michelle [name] Documented: 01/29/2020 8:16 AM Location: [facility] Patient #: [id number] DOB: [birth year] Undefined / Language: Michelle [name] / Race: White Female  History of Present Illness Michelle Moores A. Alfred Eckley MD; 01/29/2020 2:50 PM) Patient words: Pt seen [facility] for left breast cancer triple positive 1. 6 cm and second area CSL in [place]. No hx of mass discharge or breast pain. Sore from biopsy and overall in good health.  The patient is a 86 year old female.   Past Surgical History Michelle Slipper, RN; 01/29/2020 8:16 AM) Breast Biopsy Left. Hysterectomy (due to cancer) - Partial Tonsillectomy  Diagnostic Studies History Michelle Slipper, RN; 01/29/2020 8:16 AM) Colonoscopy 5-10 years ago Mammogram within last year Pap Smear >5 years ago  Medication History Michelle Slipper, RN; 01/29/2020 8:16 AM) Medications Reconciled  Social History Michelle Slipper, RN; 01/29/2020 8:16 AM) Alcohol use Occasional alcohol use. Caffeine use Coffee, Tea. No drug use Tobacco use Former smoker.  Family History Michelle Slipper, RN; 01/29/2020 8:16 AM) Alcohol Abuse Father, Sister. Arthritis Mother. Cerebrovascular Accident Mother. Hypertension Mother.  Pregnancy / Birth History Michelle Slipper, RN; 01/29/2020 8:16 AM) Age at menarche 12 years. Age of menopause 51-55 Contraceptive History Oral contraceptives. Gravida 1 Maternal age 48-25 Para 3  Other Problems Michelle Slipper, RN; 01/29/2020 8:16 AM) Gastroesophageal Reflux Disease High blood pressure Melanoma Thyroid Disease     Review of Systems (Michelle Savona A. Robbie Nangle MD; 01/29/2020 2:50 PM) General Not Present- Appetite Loss, Chills, Fatigue, Fever, Night Sweats, Weight Gain and Weight Loss. Skin Present- Rash. Not Present- Change in Wart/Mole, Dryness, Hives, Jaundice, New Lesions, Non-Healing Wounds and Ulcer. HEENT Not Present- Earache, Hearing Loss, Hoarseness, Nose Bleed, Oral Ulcers, Ringing in the Ears, Seasonal Allergies, Sinus  Pain, Sore Throat, Visual Disturbances, Wears glasses/contact lenses and Yellow Eyes. Respiratory Not Present- Bloody sputum, Chronic Cough, Difficulty Breathing, Snoring and Wheezing. Breast Not Present- Breast Mass, Breast Pain, Nipple Discharge and Skin Changes. Cardiovascular Present- Leg Cramps. Not Present- Chest Pain, Difficulty Breathing Lying Down, Palpitations, Rapid Heart Rate, Shortness of Breath and Swelling of Extremities. Gastrointestinal Present- Indigestion. Not Present- Abdominal Pain, Bloating, Bloody Stool, Change in Bowel Habits, Chronic diarrhea, Constipation, Difficulty Swallowing, Excessive gas, Gets full quickly at meals, Hemorrhoids, Nausea, Rectal Pain and Vomiting. Female Genitourinary Not Present- Frequency, Nocturia, Painful Urination, Pelvic Pain and Urgency. Musculoskeletal Not Present- Back Pain, Joint Pain, Joint Stiffness, Muscle Pain, Muscle Weakness and Swelling of Extremities. Neurological Not Present- Decreased Memory, Fainting, Headaches, Numbness, Seizures, Tingling, Tremor, Trouble walking and Weakness. Psychiatric Not Present- Anxiety, Bipolar, Change in Sleep Pattern, Depression, Fearful and Frequent crying. Endocrine Not Present- Cold Intolerance, Excessive Hunger, Hair Changes, Heat Intolerance, Hot flashes and New Diabetes. Hematology Not Present- Blood Thinners, Easy Bruising, Excessive bleeding, Gland problems, HIV and Persistent Infections. All other systems negative   Physical Exam (Michelle Wiederholt A. Yaremi Stahlman MD; 01/29/2020 2:53 PM)  General Mental Status-Alert. General Appearance-Consistent with stated age. Hydration-Well hydrated. Voice-Normal.  Head and Neck Head-normocephalic, atraumatic with no lesions or palpable masses. Trachea-midline. Thyroid Gland Characteristics - normal size and consistency.  Chest and Lung Exam Chest and lung exam reveals -quiet, even and easy respiratory effort with no use of accessory muscles and on  auscultation, normal breath sounds, no adventitious sounds and normal vocal resonance. Inspection Chest Wall - Normal. Back - normal.  Breast Note: BRUISING LEFT BREAST UOQ RIGHT BREAST NORMAL  Cardiovascular Cardiovascular examination reveals -normal heart sounds, regular rate and rhythm with no murmurs and normal pedal pulses bilaterally.  Neurologic Neurologic evaluation  reveals -alert and oriented x 3 with no impairment of recent or remote memory. Mental Status-Normal.  Musculoskeletal Normal Exam - Left-Upper Extremity Strength Normal and Lower Extremity Strength Normal. Normal Exam - Right-Upper Extremity Strength Normal and Lower Extremity Strength Normal.  Lymphatic Head & Neck  General Head & Neck Lymphatics: Bilateral - Description - Normal. Axillary  General Axillary Region: Bilateral - Description - Normal. Tenderness - Non Tender.    Assessment & Plan (Michelle Buss A. Nianna Igo MD; 01/29/2020 2:57 PM)  BREAST CANCER, LEFT (C50.912) Impression: PT OPTED FOR LEFT BREAST LUMPECTOMY AND LEFT SLN MAPPING WILL NEED PORT AS WELL  Risk of lumpectomy include bleeding, infection, seroma, more surgery, use of seed/wire, wound care, cosmetic deformity and the need for other treatments, death , blood clots, death. Pt agrees to proceed. Risk of sentinel lymph node mapping include bleeding, infection, lymphedema, shoulder pain. stiffness, dye allergy. cosmetic deformity , blood clots, death, need for more surgery. Pt agrees to proceed.  Pt requires port placement for chemotherapy. Risk include bleeding, infection, pneumothorax, hemothorax, mediastinal injury, nerve injury , blood vessel injury, stroke, blood clots, death, migration. embolization and need for additional procedures. Pt agrees to proceed.   45 min total time for exam chart review documented  Current Plans You are being scheduled for surgery- Our schedulers will call you.  You should hear from our office's  scheduling department within 5 working days about the location, date, and time of surgery. We try to make accommodations for patient's preferences in scheduling surgery, but sometimes the OR schedule or the surgeon's schedule prevents [place] from making those accommodations.  If you have not heard from our office [phone]) in 5 working days, call the office and ask for your surgeon's nurse.  If you have other questions about your diagnosis, plan, or surgery, call the office and ask for your surgeon's nurse.  Pt Education - [facility] Breast Cancer Information Given - [facility] "Breast Journey" Package We discussed the staging and pathophysiology of breast cancer. We discussed all of the different options for treatment for breast cancer including surgery, chemotherapy, radiation therapy, Herceptin, and antiestrogen therapy. We discussed a sentinel lymph node biopsy as she does not appear to having lymph node involvement right now. We discussed the performance of that with injection of radioactive tracer and blue dye. We discussed that she would have an incision underneath her axillary hairline. We discussed that there is a bout a 10-20% chance of having a positive node with a sentinel lymph node biopsy and we will await the permanent pathology to make any other first further decisions in terms of her treatment. One of these options might be to return to the operating room to perform an axillary lymph node dissection. We discussed about a 1-2% risk lifetime of chronic shoulder pain as well as lymphedema associated with a sentinel lymph node biopsy. We discussed the options for treatment of the breast cancer which included lumpectomy versus a mastectomy. We discussed the performance of the lumpectomy with a wire placement. We discussed a 10-20% chance of a positive margin requiring reexcision in the operating room. We also discussed that she may need radiation therapy or antiestrogen therapy or both if she undergoes  lumpectomy. We discussed the mastectomy and the postoperative care for that as well. We discussed that there is no difference in her survival whether she undergoes lumpectomy with radiation therapy or antiestrogen therapy versus a mastectomy. There is a slight difference in the local recurrence rate being 3-5% with lumpectomy  and about 1% with a mastectomy. We discussed the risks of operation including bleeding, infection, possible reoperation. She understands her further therapy will be based on what her stages at the time of her operation.  Pt Education - [facility] breast cancer surgery: discussed with patient and provided information. Pt Education - [facility] Breast Biopsy HCI: discussed with patient and provided information. Use of a central venous catheter for intravenous therapy was discussed. Technique of catheter placement using ultrasound and fluoroscopy guidance was discussed. Risks such as bleeding, infection, pneumothorax, catheter occlusion, reoperation, and other risks were discussed. I noted a good likelihood this will help address the problem. Questions were answered. The patient expressed understanding & wishes to proceed.

## 2020-01-29 NOTE — H&P (Signed)
Michelle [name] Documented: 01/29/2020 8:16 AM Location: [facility] Patient #: [id number] DOB: 1931/05/24 Undefined / Language: Michelle [name] / Race: White Female  History of Present Illness Michelle [name]; 01/29/2020 2:50 PM) Patient words: Pt seen [place] for left breast cancer triple positive 1. 6 cm and second area CSL in [place]. No hx of mass discharge or breast pain. Sore from biopsy and overall in good health.  The patient is a 86 year old female.   Past Surgical History Michelle Slipper, Michelle [name]; 01/29/2020 8:16 AM) Breast Biopsy Left. Hysterectomy (due to cancer) - Partial Tonsillectomy  Diagnostic Studies History Michelle Slipper, Michelle [name]; 01/29/2020 8:16 AM) Colonoscopy 5-10 years ago Mammogram within last year Pap Smear >5 years ago  Medication History Michelle Slipper, Michelle [name]; 01/29/2020 8:16 AM) Medications Reconciled  Social History Michelle Slipper, Michelle [name]; 01/29/2020 8:16 AM) Alcohol use Occasional alcohol use. Caffeine use Coffee, Tea. No drug use Tobacco use Former smoker.  Family History Michelle Slipper, Michelle [name]; 01/29/2020 8:16 AM) Alcohol Abuse Father, Sister. Arthritis Mother. Cerebrovascular Accident Mother. Hypertension Mother.  Pregnancy / Birth History Michelle Slipper, Michelle [name]; 01/29/2020 8:16 AM) Age at menarche 51 years. Age of menopause 51-55 Contraceptive History Oral contraceptives. Gravida 1 Maternal age 47-25 Para 17  Other Problems Michelle Slipper, Michelle [name]; 01/29/2020 8:16 AM) Gastroesophageal Reflux Disease High blood pressure Melanoma Thyroid Disease     Review of Systems (Michelle [name]; 01/29/2020 2:50 PM) General Not Present- Appetite Loss, Chills, Fatigue, Fever, Night Sweats, Weight Gain and Weight Loss. Skin Present- Rash. Not Present- Change in Wart/Mole, Dryness, Hives, Jaundice, New Lesions, Non-Healing Wounds and Ulcer. HEENT Not Present- Earache, Hearing Loss, Hoarseness, Nose Bleed, Oral Ulcers, Ringing in the Ears, Seasonal Allergies, Sinus  Pain, Sore Throat, Visual Disturbances, Wears glasses/contact lenses and Yellow Eyes. Respiratory Not Present- Bloody sputum, Chronic Cough, Difficulty Breathing, Snoring and Wheezing. Breast Not Present- Breast Mass, Breast Pain, Nipple Discharge and Skin Changes. Cardiovascular Present- Leg Cramps. Not Present- Chest Pain, Difficulty Breathing Lying Down, Palpitations, Rapid Heart Rate, Shortness of Breath and Swelling of Extremities. Gastrointestinal Present- Indigestion. Not Present- Abdominal Pain, Bloating, Bloody Stool, Change in Bowel Habits, Chronic diarrhea, Constipation, Difficulty Swallowing, Excessive gas, Gets full quickly at meals, Hemorrhoids, Nausea, Rectal Pain and Vomiting. Female Genitourinary Not Present- Frequency, Nocturia, Painful Urination, Pelvic Pain and Urgency. Musculoskeletal Not Present- Back Pain, Joint Pain, Joint Stiffness, Muscle Pain, Muscle Weakness and Swelling of Extremities. Neurological Not Present- Decreased Memory, Fainting, Headaches, Numbness, Seizures, Tingling, Tremor, Trouble walking and Weakness. Psychiatric Not Present- Anxiety, Bipolar, Change in Sleep Pattern, Depression, Fearful and Frequent crying. Endocrine Not Present- Cold Intolerance, Excessive Hunger, Hair Changes, Heat Intolerance, Hot flashes and New Diabetes. Hematology Not Present- Blood Thinners, Easy Bruising, Excessive bleeding, Gland problems, HIV and Persistent Infections. All other systems negative   Physical Exam (Michelle [name]; 01/29/2020 2:53 PM)  General Mental Status-Alert. General Appearance-Consistent with stated age. Hydration-Well hydrated. Voice-Normal.  Head and Neck Head-normocephalic, atraumatic with no lesions or palpable masses. Trachea-midline. Thyroid Gland Characteristics - normal size and consistency.  Chest and Lung Exam Chest and lung exam reveals -quiet, even and easy respiratory effort with no use of accessory muscles and on  auscultation, normal breath sounds, no adventitious sounds and normal vocal resonance. Inspection Chest Wall - Normal. Back - normal.  Breast Note: BRUISING LEFT BREAST UOQ RIGHT BREAST NORMAL  Cardiovascular Cardiovascular examination reveals -normal heart sounds, regular rate and rhythm with no murmurs and normal pedal pulses bilaterally.  Neurologic Neurologic evaluation  reveals -alert and oriented x 3 with no impairment of recent or remote memory. Mental Status-Normal.  Musculoskeletal Normal Exam - Left-Upper Extremity Strength Normal and Lower Extremity Strength Normal. Normal Exam - Right-Upper Extremity Strength Normal and Lower Extremity Strength Normal.  Lymphatic Head & Neck  General Head & Neck Lymphatics: Bilateral - Description - Normal. Axillary  General Axillary Region: Bilateral - Description - Normal. Tenderness - Non Tender.    Assessment & Plan (Michelle [name]; 01/29/2020 2:57 PM)  BREAST CANCER, LEFT (C50.912) Impression: PT OPTED FOR LEFT BREAST LUMPECTOMY AND LEFT SLN MAPPING WILL NEED PORT AS WELL  Risk of lumpectomy include bleeding, infection, seroma, more surgery, use of seed/wire, wound care, cosmetic deformity and the need for other treatments, death , blood clots, death. Pt agrees to proceed. Risk of sentinel lymph node mapping include bleeding, infection, lymphedema, shoulder pain. stiffness, dye allergy. cosmetic deformity , blood clots, death, need for more surgery. Pt agrees to proceed.  Pt requires port placement for chemotherapy. Risk include bleeding, infection, pneumothorax, hemothorax, mediastinal injury, nerve injury , blood vessel injury, stroke, blood clots, death, migration. embolization and need for additional procedures. Pt agrees to proceed.   45 min total time for exam chart review documented  Current Plans You are being scheduled for surgery- Our schedulers will call you.  You should hear from our office's  scheduling department within 5 working days about the location, date, and time of surgery. We try to make accommodations for patient's preferences in scheduling surgery, but sometimes the OR schedule or the surgeon's schedule prevents [place] from making those accommodations.  If you have not heard from our office [phone]) in 5 working days, call the office and ask for your surgeon's nurse.  If you have other questions about your diagnosis, plan, or surgery, call the office and ask for your surgeon's nurse.  Pt Education - [facility] Breast Cancer Information Given - [facility] "Breast Journey" Package We discussed the staging and pathophysiology of breast cancer. We discussed all of the different options for treatment for breast cancer including surgery, chemotherapy, radiation therapy, Herceptin, and antiestrogen therapy. We discussed a sentinel lymph node biopsy as she does not appear to having lymph node involvement right now. We discussed the performance of that with injection of radioactive tracer and blue dye. We discussed that she would have an incision underneath her axillary hairline. We discussed that there is a bout a 10-20% chance of having a positive node with a sentinel lymph node biopsy and we will await the permanent pathology to make any other first further decisions in terms of her treatment. One of these options might be to return to the operating room to perform an axillary lymph node dissection. We discussed about a 1-2% risk lifetime of chronic shoulder pain as well as lymphedema associated with a sentinel lymph node biopsy. We discussed the options for treatment of the breast cancer which included lumpectomy versus a mastectomy. We discussed the performance of the lumpectomy with a wire placement. We discussed a 10-20% chance of a positive margin requiring reexcision in the operating room. We also discussed that she may need radiation therapy or antiestrogen therapy or both if she undergoes  lumpectomy. We discussed the mastectomy and the postoperative care for that as well. We discussed that there is no difference in her survival whether she undergoes lumpectomy with radiation therapy or antiestrogen therapy versus a mastectomy. There is a slight difference in the local recurrence rate being 3-5% with lumpectomy  and about 1% with a mastectomy. We discussed the risks of operation including bleeding, infection, possible reoperation. She understands her further therapy will be based on what her stages at the time of her operation.  Pt Education - [facility] breast cancer surgery: discussed with patient and provided information. Pt Education - [facility] Breast Biopsy HCI: discussed with patient and provided information. Use of a central venous catheter for intravenous therapy was discussed. Technique of catheter placement using ultrasound and fluoroscopy guidance was discussed. Risks such as bleeding, infection, pneumothorax, catheter occlusion, reoperation, and other risks were discussed. I noted a good likelihood this will help address the problem. Questions were answered. The patient expressed understanding & wishes to proceed.
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
## 2020-01-29 NOTE — Patient Instructions (Signed)

## 2020-01-29 NOTE — Progress Notes (Signed)
e

## 2020-01-29 NOTE — Progress Notes (Signed)
Clinical Social Work [place] Psychosocial Distress Screening [place]   Patient completed distress screening protocol and scored a 7 on the Psychosocial Distress Thermometer which indicates moderate distress. Clinical Social Worker met with patient in [facility] to assess for distress and other psychosocial needs.  Patient stated she was feeling overwhelmed with diagnosis and treatment plan. She is feeling a little better after meeting with the team, but is still nervous about surgery due to her age.  She lives at [facility] in the independent living wing. She has support there from the other women and staff.   CSW and patient discussed common feeling and emotions when being diagnosed with cancer, and the importance of support during treatment.  CSW informed patient of the support team and support services at [facility].  CSW provided contact information and encouraged patient to call with any questions or concerns.    Distress Screen: ONCBCN DISTRESS SCREENING 01/29/2020  Screening Type Initial Screening  Distress experienced in past week (1-10) 7  Emotional problem type Nervousness/Anxiety  Information Concerns Type Lack of info about diagnosis;Lack of info about treatment     [place]

## 2020-01-29 NOTE — Progress Notes (Signed)
Radiation Oncology         [phone] ________________________________  Initial outpatient Consultation in person  Name: Michelle [name] MRN: [id number]  Date: 01/29/2020  DOB: 11-Jan-1931  CC:McKenzie, Lilia Argue, MD (Inactive)  Cornett, Marcello Moores, MD   REFERRING PHYSICIAN: Erroll Luna, MD  DIAGNOSIS:    ICD-10-CM   1. Malignant neoplasm of upper-inner quadrant of left breast in female, estrogen receptor positive ([place])  C50.212    Z17.0    Cancer Staging Malignant neoplasm of upper-inner quadrant of left breast in female, estrogen receptor positive ([place]) Staging form: Breast, AJCC 8th Edition - Clinical stage from 01/29/2020: Stage IA (cT1c, cN0, cM0, G3, ER+, PR+, HER2+) - Signed by Eppie Gibson, MD on 01/29/2020   CHIEF COMPLAINT: Here to discuss management of left breast cancer  HISTORY OF PRESENT ILLNESS::Michelle [name] is a 86 y.o. female who presented with palpable left breast lump 1 day after she showered.  Other symptoms, if any, at that time, were: none.  On mammography there was an area of architectural distortion measuring 2 cm in the left breast with an adjacent 9 mm lesion; ultrasound of breast on 12/31/2019 revealed 1.6 cm left breast mass at 11 o'clock and 0.8 cm oval mass at 10 o'clock.  Axilla was clinically negative. Biopsy on date of 01/15/2020 showed invasive ductal carcinoma at 11 o'clock, and a complex sclerosing lesion at 10 o'clock.  ER status: 100%; PR status 5%, Her2 status positive; Grade 3.  She has met with surgery and medical oncology.  She anticipates breast conserving surgery with sentinel lymph node biopsy followed by Herceptin.  She lives at the [facility] in independent living.  PREVIOUS RADIATION THERAPY: No  PAST MEDICAL HISTORY:  has a past medical history of Arthritis, Cataract, GERD (gastroesophageal reflux disease), Hyperlipidemia, Hypertension, Neck injury, Neuromuscular disorder ([place]), Osteopenia, and Thyroid disease.    PAST SURGICAL  HISTORY: Past Surgical History:  Procedure Laterality Date  . ABDOMINAL HYSTERECTOMY  1971  . BREAST LUMPECTOMY     benign x2  . COLONOSCOPY     3 years ago  . POLYPECTOMY    . TONSILLECTOMY AND ADENOIDECTOMY      FAMILY HISTORY: family history is not on file.  SOCIAL HISTORY:  reports that she has quit smoking. She has never used smokeless tobacco. She reports current alcohol use. She reports that she does not use drugs.  ALLERGIES: Atorvastatin and Pravastatin  MEDICATIONS:  Current Outpatient Medications  Medication Sig Dispense Refill  . acetaminophen (TYLENOL) 500 MG tablet Take 1 tablet (500 mg total) by mouth every 6 (six) hours as needed. (Patient not taking: Reported on 01/29/2020) 30 tablet 0  . ALPRAZolam (XANAX) 0.25 MG tablet Take 0.25 mg by mouth 2 (two) times daily as needed for anxiety.  0  . aspirin 81 MG tablet Take 81 mg by mouth at bedtime.     . Calcium Carbonate-Vitamin D (CALCIUM + D PO) Take 600 mg by mouth 2 (two) times daily.     . Carboxymethylcellulose Sodium (EYE DROPS OP) Apply 1-2 drops to eye daily as needed (dry eye).    Marland Kitchen esomeprazole (NEXIUM) 20 MG capsule Take 20 mg by mouth daily.    Marland Kitchen losartan (COZAAR) 25 MG tablet losartan 25 mg tablet    . meclizine (ANTIVERT) 12.5 MG tablet Take 1 tablet (12.5 mg total) by mouth 3 (three) times daily as needed for dizziness. (Patient not taking: Reported on 01/29/2020) 15 tablet 0  . Multiple Vitamins-Minerals (CENTRUM  SILVER PO) Take 1 tablet by mouth daily.    . phenazopyridine (PYRIDIUM) 200 MG tablet Take 1 tablet (200 mg total) by mouth 3 (three) times daily. (Patient not taking: Reported on 01/29/2020) 6 tablet 0  . SYNTHROID 75 MCG tablet Take 75 mcg by mouth Daily.     No current facility-administered medications for this encounter.    REVIEW OF SYSTEMS:  As above  PHYSICAL EXAM:  vitals were not taken for this visit.   General: Alert and oriented, in no acute distress Psychiatric: Judgment and  insight are intact. Affect is appropriate. Breasts: In the left breast there is upper inner quadrant bruising related to her recent biopsy with a 1 cm mass palpated at 11:00. No other palpable masses appreciated in the breasts or axillae bilaterally.    ECOG = 1  0 - Asymptomatic (Fully active, able to carry on all predisease activities without restriction)  1 - Symptomatic but completely ambulatory (Restricted in physically strenuous activity but ambulatory and able to carry out work of a light or sedentary nature. For example, light housework, office work)  2 - Symptomatic, <50% in bed during the day (Ambulatory and capable of all self care but unable to carry out any work activities. Up and about more than 50% of waking hours)  3 - Symptomatic, >50% in bed, but not bedbound (Capable of only limited self-care, confined to bed or chair 50% or more of waking hours)  4 - Bedbound (Completely disabled. Cannot carry on any self-care. Totally confined to bed or chair)  5 - Death   Eustace Pen MM, Creech RH, Tormey DC, et al. [phone]). "Toxicity and response criteria of the [facility]". [place] Oncol. 5 (6): 649-55   LABORATORY DATA:  Lab Results  Component Value Date   WBC 5.1 01/29/2020   HGB 14.0 01/29/2020   HCT 43.3 01/29/2020   MCV 94.1 01/29/2020   PLT 155 01/29/2020   CMP     Component Value Date/Time   NA 140 01/29/2020 1159   K 4.1 01/29/2020 1159   CL 104 01/29/2020 1159   CO2 27 01/29/2020 1159   GLUCOSE 119 (H) 01/29/2020 1159   BUN 11 01/29/2020 1159   CREATININE 0.93 01/29/2020 1159   CALCIUM 9.4 01/29/2020 1159   PROT 6.9 01/29/2020 1159   ALBUMIN 3.8 01/29/2020 1159   AST 20 01/29/2020 1159   ALT 16 01/29/2020 1159   ALKPHOS 62 01/29/2020 1159   BILITOT 0.5 01/29/2020 1159   GFRNONAA 55 (L) 01/29/2020 1159   GFRAA >60 01/29/2020 1159         RADIOGRAPHY: As above, I personally reviewed her images.   IMPRESSION/PLAN: Left Breast  Cancer   It was a pleasure meeting the patient today. We discussed the risks, benefits, and side effects of radiotherapy. I recommend radiotherapy to the left breast to reduce her risk of locoregional recurrence by 2/3.  We discussed that radiation would take approximately 3-4 weeks to complete and that I would give the patient a few weeks to heal following surgery before starting treatment planning.  She understands that Herceptin can be given concurrently with radiation. We spoke about acute effects including skin irritation and fatigue as well as much less common late effects including internal organ injury or irritation. We spoke about the latest technology that is used to minimize the risk of late effects for patients undergoing radiotherapy to the breast or chest wall. No guarantees of treatment were given.  Of note, she understands that radiotherapy would not improve her life expectancy.  She understands that radiotherapy is optional and that I recommended it based on the histology of her tumor and the potential benefits for local control.  The patient is enthusiastic about proceeding with treatment. I look forward to participating in the patient's care.  I will await her referral back to me for postoperative follow-up and eventual CT simulation/treatment planning.   On date of service, in total, I spent 45 minutes on this encounter.  Patient was seen in person.    __________________________________________   Eppie Gibson, MD   This document serves as a record of services personally performed by Eppie Gibson, MD. It was created on her behalf by Wilburn Mylar, a trained medical scribe. The creation of this record is based on the scribe's personal observations and the provider's statements to them. This document has been checked and approved by the attending provider.

## 2020-01-30 ENCOUNTER — Telehealth: Payer: Self-pay | Admitting: Oncology

## 2020-01-30 NOTE — Telephone Encounter (Signed)
Scheduled appts per 4/21 los. Pt confirmed appt date and time.

## 2020-01-31 ENCOUNTER — Other Ambulatory Visit: Payer: Self-pay

## 2020-01-31 ENCOUNTER — Ambulatory Visit (HOSPITAL_COMMUNITY)
Admission: RE | Admit: 2020-01-31 | Discharge: 2020-01-31 | Disposition: A | Discharge status: Home / Self Care | Payer: Medicare Other | Source: Ambulatory Visit | Attending: Oncology | Admitting: Oncology

## 2020-01-31 DIAGNOSIS — K219 Gastro-esophageal reflux disease without esophagitis: Secondary | ICD-10-CM | POA: Insufficient documentation

## 2020-01-31 DIAGNOSIS — Z01818 Encounter for other preprocedural examination: Secondary | ICD-10-CM | POA: Insufficient documentation

## 2020-01-31 DIAGNOSIS — I080 Rheumatic disorders of both mitral and aortic valves: Secondary | ICD-10-CM | POA: Insufficient documentation

## 2020-01-31 DIAGNOSIS — I119 Hypertensive heart disease without heart failure: Secondary | ICD-10-CM | POA: Diagnosis not present

## 2020-01-31 DIAGNOSIS — Z170 Estrogen receptor positive status [ER+]: Secondary | ICD-10-CM | POA: Diagnosis not present

## 2020-01-31 DIAGNOSIS — E079 Disorder of thyroid, unspecified: Secondary | ICD-10-CM | POA: Insufficient documentation

## 2020-01-31 DIAGNOSIS — E785 Hyperlipidemia, unspecified: Secondary | ICD-10-CM | POA: Diagnosis not present

## 2020-01-31 DIAGNOSIS — C50212 Malignant neoplasm of upper-inner quadrant of left female breast: Secondary | ICD-10-CM | POA: Diagnosis not present

## 2020-01-31 NOTE — Progress Notes (Signed)
  Echocardiogram 2D Echocardiogram has been performed.  Michelle [name] Michelle [name] 01/31/2020, 11:19 AM

## 2020-02-06 ENCOUNTER — Encounter: Payer: Self-pay | Admitting: *Deleted

## 2020-02-06 NOTE — Progress Notes (Signed)
Spoke with patient to follow up from [facility] and assess navigation needs.  Answered questions and encouraged to call with any other needs or concerns.

## 2020-02-07 ENCOUNTER — Encounter: Payer: Self-pay | Admitting: Nutrition

## 2020-02-07 NOTE — Progress Notes (Signed)
Chart reviewed. Patient with newly diagnosed breast cancer. Plan is for chemotherapy with port placement. Patient given a nutrition packet at clinic. No current nutrition needs identified. Will follow up for nutrition needs after initiation of chemotherapy.

## 2020-02-08 DIAGNOSIS — C801 Malignant (primary) neoplasm, unspecified: Secondary | ICD-10-CM

## 2020-02-08 HISTORY — DX: Malignant (primary) neoplasm, unspecified: C80.1

## 2020-02-14 ENCOUNTER — Encounter (HOSPITAL_BASED_OUTPATIENT_CLINIC_OR_DEPARTMENT_OTHER): Payer: Self-pay | Admitting: Surgery

## 2020-02-14 ENCOUNTER — Other Ambulatory Visit: Payer: Self-pay

## 2020-02-17 ENCOUNTER — Other Ambulatory Visit (HOSPITAL_COMMUNITY)
Admission: RE | Admit: 2020-02-17 | Discharge: 2020-02-17 | Disposition: A | Discharge status: Home / Self Care | Payer: Medicare Other | Source: Ambulatory Visit | Attending: Surgery | Admitting: Surgery

## 2020-02-17 DIAGNOSIS — Z01812 Encounter for preprocedural laboratory examination: Secondary | ICD-10-CM | POA: Diagnosis present

## 2020-02-17 DIAGNOSIS — Z20822 Contact with and (suspected) exposure to covid-19: Secondary | ICD-10-CM | POA: Diagnosis not present

## 2020-02-17 LAB — SARS CORONAVIRUS 2 (TAT 6-24 HRS): SARS Coronavirus 2: NEGATIVE

## 2020-02-19 ENCOUNTER — Encounter (HOSPITAL_BASED_OUTPATIENT_CLINIC_OR_DEPARTMENT_OTHER)
Admission: RE | Admit: 2020-02-19 | Discharge: 2020-02-19 | Disposition: A | Discharge status: Home / Self Care | Payer: Medicare Other | Source: Ambulatory Visit | Attending: Surgery | Admitting: Surgery

## 2020-02-19 DIAGNOSIS — C50812 Malignant neoplasm of overlapping sites of left female breast: Secondary | ICD-10-CM | POA: Diagnosis present

## 2020-02-19 DIAGNOSIS — Z170 Estrogen receptor positive status [ER+]: Secondary | ICD-10-CM | POA: Diagnosis not present

## 2020-02-19 DIAGNOSIS — I10 Essential (primary) hypertension: Secondary | ICD-10-CM | POA: Diagnosis not present

## 2020-02-19 DIAGNOSIS — Z85820 Personal history of malignant melanoma of skin: Secondary | ICD-10-CM | POA: Diagnosis not present

## 2020-02-19 DIAGNOSIS — D242 Benign neoplasm of left breast: Secondary | ICD-10-CM | POA: Diagnosis not present

## 2020-02-19 DIAGNOSIS — M1990 Unspecified osteoarthritis, unspecified site: Secondary | ICD-10-CM | POA: Diagnosis not present

## 2020-02-19 DIAGNOSIS — Z87891 Personal history of nicotine dependence: Secondary | ICD-10-CM | POA: Diagnosis not present

## 2020-02-19 LAB — COMPREHENSIVE METABOLIC PANEL
ALT: 17 U/L (ref 0–44)
AST: 23 U/L (ref 15–41)
Albumin: 3.8 g/dL (ref 3.5–5.0)
Alkaline Phosphatase: 63 U/L (ref 38–126)
Anion gap: 11 (ref 5–15)
BUN: 8 mg/dL (ref 8–23)
CO2: 26 mmol/L (ref 22–32)
Calcium: 9 mg/dL (ref 8.9–10.3)
Chloride: 98 mmol/L (ref 98–111)
Creatinine, Ser: 0.9 mg/dL (ref 0.44–1.00)
GFR calc Af Amer: >60 mL/min (ref >60)
GFR calc non Af Amer: 57 mL/min — ABNORMAL LOW (ref >60)
Glucose, Bld: 93 mg/dL (ref 70–99)
Potassium: 4.3 mmol/L (ref 3.5–5.1)
Sodium: 135 mmol/L (ref 135–145)
Total Bilirubin: 0.7 mg/dL (ref 0.3–1.2)
Total Protein: 6 g/dL — ABNORMAL LOW (ref 6.5–8.1)

## 2020-02-19 LAB — CBC WITH DIFFERENTIAL/PLATELET
Abs Immature Granulocytes: 0.01 10*3/uL (ref 0.00–0.07)
Basophils Absolute: 0 10*3/uL (ref 0.0–0.1)
Basophils Relative: 0 %
Eosinophils Absolute: 0.1 10*3/uL (ref 0.0–0.5)
Eosinophils Relative: 2 %
HCT: 41 % (ref 36.0–46.0)
Hemoglobin: 13.3 g/dL (ref 12.0–15.0)
Immature Granulocytes: 0 %
Lymphocytes Relative: 29 %
Lymphs Abs: 1.5 10*3/uL (ref 0.7–4.0)
MCH: 30.1 pg (ref 26.0–34.0)
MCHC: 32.4 g/dL (ref 30.0–36.0)
MCV: 92.8 fL (ref 80.0–100.0)
Monocytes Absolute: 0.7 10*3/uL (ref 0.1–1.0)
Monocytes Relative: 13 %
Neutro Abs: 2.8 10*3/uL (ref 1.7–7.7)
Neutrophils Relative %: 56 %
Platelets: 140 10*3/uL — ABNORMAL LOW (ref 150–400)
RBC: 4.42 MIL/uL (ref 3.87–5.11)
RDW: 12.1 % (ref 11.5–15.5)
WBC: 5 10*3/uL (ref 4.0–10.5)
nRBC: 0 % (ref 0.0–0.2)

## 2020-02-19 NOTE — Progress Notes (Signed)

## 2020-02-20 ENCOUNTER — Encounter (HOSPITAL_COMMUNITY)
Admission: RE | Admit: 2020-02-20 | Discharge: 2020-02-20 | Disposition: A | Discharge status: Home / Self Care | Payer: Medicare Other | Source: Ambulatory Visit | Attending: Surgery | Admitting: Surgery

## 2020-02-20 ENCOUNTER — Encounter (HOSPITAL_BASED_OUTPATIENT_CLINIC_OR_DEPARTMENT_OTHER)
Admission: RE | Disposition: A | Discharge status: Home / Self Care | Payer: Self-pay | Source: Home / Self Care | Attending: Surgery

## 2020-02-20 ENCOUNTER — Ambulatory Visit (HOSPITAL_BASED_OUTPATIENT_CLINIC_OR_DEPARTMENT_OTHER): Payer: Medicare Other | Admitting: Anesthesiology

## 2020-02-20 ENCOUNTER — Ambulatory Visit (HOSPITAL_BASED_OUTPATIENT_CLINIC_OR_DEPARTMENT_OTHER)
Admission: RE | Admit: 2020-02-20 | Discharge: 2020-02-20 | Disposition: A | Discharge status: Home / Self Care | Payer: Medicare Other | Attending: Surgery | Admitting: Surgery

## 2020-02-20 ENCOUNTER — Ambulatory Visit (HOSPITAL_COMMUNITY): Payer: Medicare Other

## 2020-02-20 ENCOUNTER — Other Ambulatory Visit: Payer: Self-pay

## 2020-02-20 ENCOUNTER — Encounter (HOSPITAL_BASED_OUTPATIENT_CLINIC_OR_DEPARTMENT_OTHER): Payer: Self-pay | Admitting: Surgery

## 2020-02-20 DIAGNOSIS — D242 Benign neoplasm of left breast: Secondary | ICD-10-CM | POA: Diagnosis not present

## 2020-02-20 DIAGNOSIS — I10 Essential (primary) hypertension: Secondary | ICD-10-CM | POA: Insufficient documentation

## 2020-02-20 DIAGNOSIS — Z85820 Personal history of malignant melanoma of skin: Secondary | ICD-10-CM | POA: Insufficient documentation

## 2020-02-20 DIAGNOSIS — C50812 Malignant neoplasm of overlapping sites of left female breast: Secondary | ICD-10-CM | POA: Insufficient documentation

## 2020-02-20 DIAGNOSIS — Z95828 Presence of other vascular implants and grafts: Secondary | ICD-10-CM

## 2020-02-20 DIAGNOSIS — M1990 Unspecified osteoarthritis, unspecified site: Secondary | ICD-10-CM | POA: Insufficient documentation

## 2020-02-20 DIAGNOSIS — Z170 Estrogen receptor positive status [ER+]: Secondary | ICD-10-CM | POA: Insufficient documentation

## 2020-02-20 DIAGNOSIS — Z87891 Personal history of nicotine dependence: Secondary | ICD-10-CM | POA: Diagnosis not present

## 2020-02-20 HISTORY — PX: BREAST LUMPECTOMY WITH RADIOACTIVE SEED AND SENTINEL LYMPH NODE BIOPSY: SHX6550

## 2020-02-20 HISTORY — PX: PORTACATH PLACEMENT: SHX2246

## 2020-02-20 SURGERY — BREAST LUMPECTOMY WITH RADIOACTIVE SEED AND SENTINEL LYMPH NODE BIOPSY
Anesthesia: Regional | Site: Chest | Laterality: Right

## 2020-02-20 MED ORDER — FENTANYL CITRATE (PF) 100 MCG/2ML IJ SOLN
INTRAMUSCULAR | Status: AC
  Filled 2020-02-20: qty 2

## 2020-02-20 MED ORDER — EPHEDRINE 5 MG/ML INJ
INTRAVENOUS | Status: AC
  Filled 2020-02-20: qty 10

## 2020-02-20 MED ORDER — CEFAZOLIN SODIUM-DEXTROSE 2-4 GM/100ML-% IV SOLN
2.0000 g | INTRAVENOUS | Status: AC
  Administered 2020-02-20: 2 g via INTRAVENOUS

## 2020-02-20 MED ORDER — ORAL CARE MOUTH RINSE: 15.0000 mL | Freq: Once | OROMUCOSAL | Status: DC

## 2020-02-20 MED ORDER — ONDANSETRON HCL 4 MG/2ML IJ SOLN
INTRAMUSCULAR | Status: DC | PRN
  Administered 2020-02-20: 4 mg via INTRAVENOUS

## 2020-02-20 MED ORDER — EPHEDRINE SULFATE 50 MG/ML IJ SOLN
INTRAMUSCULAR | Status: DC | PRN
  Administered 2020-02-20 (×2): 5 mg via INTRAVENOUS
  Administered 2020-02-20: 10 mg via INTRAVENOUS

## 2020-02-20 MED ORDER — MIDAZOLAM HCL 2 MG/2ML IJ SOLN
INTRAMUSCULAR | Status: AC
  Filled 2020-02-20: qty 2

## 2020-02-20 MED ORDER — LIDOCAINE HCL (CARDIAC) PF 100 MG/5ML IV SOSY
PREFILLED_SYRINGE | INTRAVENOUS | Status: DC | PRN
  Administered 2020-02-20: 20 mg via INTRAVENOUS

## 2020-02-20 MED ORDER — DEXAMETHASONE SODIUM PHOSPHATE 4 MG/ML IJ SOLN
INTRAMUSCULAR | Status: DC | PRN
  Administered 2020-02-20: 5 mg via INTRAVENOUS

## 2020-02-20 MED ORDER — HYDROCODONE-ACETAMINOPHEN 5-325 MG PO TABS: 1.0000 | ORAL_TABLET | Freq: Four times a day (QID) | ORAL | 0 refills | Status: DC | PRN

## 2020-02-20 MED ORDER — LIDOCAINE 2% (20 MG/ML) 5 ML SYRINGE
INTRAMUSCULAR | Status: AC
  Filled 2020-02-20: qty 5

## 2020-02-20 MED ORDER — CHLORHEXIDINE GLUCONATE 0.12 % MT SOLN
15.0000 mL | Freq: Once | OROMUCOSAL | Status: DC
  Filled 2020-02-20: qty 15

## 2020-02-20 MED ORDER — FENTANYL CITRATE (PF) 100 MCG/2ML IJ SOLN
50.0000 ug | INTRAMUSCULAR | Status: DC | PRN
  Administered 2020-02-20 (×2): 50 ug via INTRAVENOUS

## 2020-02-20 MED ORDER — DEXAMETHASONE SODIUM PHOSPHATE 10 MG/ML IJ SOLN
INTRAMUSCULAR | Status: DC | PRN
  Administered 2020-02-20: 5 mg

## 2020-02-20 MED ORDER — CHLORHEXIDINE GLUCONATE CLOTH 2 % EX PADS: 6.0000 | MEDICATED_PAD | Freq: Once | CUTANEOUS | Status: DC

## 2020-02-20 MED ORDER — PHENYLEPHRINE 40 MCG/ML (10ML) SYRINGE FOR IV PUSH (FOR BLOOD PRESSURE SUPPORT)
PREFILLED_SYRINGE | INTRAVENOUS | Status: AC
  Filled 2020-02-20: qty 10

## 2020-02-20 MED ORDER — DEXAMETHASONE SODIUM PHOSPHATE 10 MG/ML IJ SOLN
INTRAMUSCULAR | Status: AC
  Filled 2020-02-20: qty 1

## 2020-02-20 MED ORDER — PROPOFOL 10 MG/ML IV BOLUS
INTRAVENOUS | Status: DC | PRN
  Administered 2020-02-20: 70 mg via INTRAVENOUS

## 2020-02-20 MED ORDER — HEPARIN (PORCINE) IN NACL 2-0.9 UNITS/ML
INTRAMUSCULAR | Status: AC | PRN
  Administered 2020-02-20: 1

## 2020-02-20 MED ORDER — ROPIVACAINE HCL 5 MG/ML IJ SOLN
INTRAMUSCULAR | Status: DC | PRN
  Administered 2020-02-20: 30 mL via PERINEURAL

## 2020-02-20 MED ORDER — PHENYLEPHRINE HCL (PRESSORS) 10 MG/ML IV SOLN
INTRAVENOUS | Status: DC | PRN
  Administered 2020-02-20 (×3): 40 ug via INTRAVENOUS

## 2020-02-20 MED ORDER — HEPARIN SOD (PORK) LOCK FLUSH 100 UNIT/ML IV SOLN
INTRAVENOUS | Status: DC | PRN
  Administered 2020-02-20: 500 [IU] via INTRAVENOUS

## 2020-02-20 MED ORDER — ACETAMINOPHEN 500 MG PO TABS
ORAL_TABLET | ORAL | Status: AC
  Filled 2020-02-20: qty 2

## 2020-02-20 MED ORDER — TECHNETIUM TC 99M SULFUR COLLOID FILTERED
1.0000 | Freq: Once | INTRAVENOUS | Status: AC | PRN
  Administered 2020-02-20: 1 via INTRADERMAL

## 2020-02-20 MED ORDER — BUPIVACAINE HCL (PF) 0.25 % IJ SOLN
INTRAMUSCULAR | Status: DC | PRN
  Administered 2020-02-20: 23 mL

## 2020-02-20 MED ORDER — MIDAZOLAM HCL 2 MG/2ML IJ SOLN: 1.0000 mg | INTRAMUSCULAR | Status: DC | PRN

## 2020-02-20 MED ORDER — LACTATED RINGERS IV SOLN: INTRAVENOUS | Status: DC

## 2020-02-20 MED ORDER — FENTANYL CITRATE (PF) 100 MCG/2ML IJ SOLN
25.0000 ug | INTRAMUSCULAR | Status: DC | PRN
  Administered 2020-02-20 (×2): 25 ug via INTRAVENOUS

## 2020-02-20 MED ORDER — CEFAZOLIN SODIUM-DEXTROSE 2-4 GM/100ML-% IV SOLN
INTRAVENOUS | Status: AC
  Filled 2020-02-20: qty 100

## 2020-02-20 MED ORDER — ACETAMINOPHEN 500 MG PO TABS
1000.0000 mg | ORAL_TABLET | ORAL | Status: AC
  Administered 2020-02-20: 1000 mg via ORAL

## 2020-02-20 MED ORDER — PROPOFOL 10 MG/ML IV BOLUS
INTRAVENOUS | Status: AC
  Filled 2020-02-20: qty 20

## 2020-02-20 SURGICAL SUPPLY — 59 items
APPLIER CLIP 9.375 MED OPEN (MISCELLANEOUS) ×4
BAG DECANTER FOR FLEXI CONT (MISCELLANEOUS) ×4 IMPLANT
BINDER BREAST LRG (GAUZE/BANDAGES/DRESSINGS) IMPLANT
BINDER BREAST XLRG (GAUZE/BANDAGES/DRESSINGS) IMPLANT
BLADE HEX COATED 2.75 (ELECTRODE) ×4 IMPLANT
BLADE SURG 11 STRL SS (BLADE) ×4 IMPLANT
BLADE SURG 15 STRL LF DISP TIS (BLADE) ×2 IMPLANT
BLADE SURG 15 STRL SS (BLADE) ×4
CANISTER SUC SOCK COL 7IN (MISCELLANEOUS) IMPLANT
CANISTER SUCT 1200ML W/VALVE (MISCELLANEOUS) ×4 IMPLANT
CHLORAPREP W/TINT 26 (MISCELLANEOUS) ×6 IMPLANT
CLIP APPLIE 9.375 MED OPEN (MISCELLANEOUS) ×2 IMPLANT
COVER BACK TABLE 60X90IN (DRAPES) ×4 IMPLANT
COVER MAYO STAND STRL (DRAPES) ×4 IMPLANT
COVER PROBE 5X48 (MISCELLANEOUS) ×4
COVER PROBE W GEL 5X96 (DRAPES) ×4 IMPLANT
DERMABOND ADVANCED (GAUZE/BANDAGES/DRESSINGS) ×2
DERMABOND ADVANCED .7 DNX12 (GAUZE/BANDAGES/DRESSINGS) ×2 IMPLANT
DRAPE C-ARM 42X72 X-RAY (DRAPES) ×4 IMPLANT
DRAPE LAPAROSCOPIC ABDOMINAL (DRAPES) ×6 IMPLANT
DRAPE UTILITY XL STRL (DRAPES) ×6 IMPLANT
ELECT COATED BLADE 2.86 ST (ELECTRODE) ×4 IMPLANT
ELECT REM PT RETURN 9FT ADLT (ELECTROSURGICAL) ×4
ELECTRODE REM PT RTRN 9FT ADLT (ELECTROSURGICAL) ×2 IMPLANT
GLOVE BIO SURGEON STRL SZ 6.5 (GLOVE) ×1 IMPLANT
GLOVE BIO SURGEONS STRL SZ 6.5 (GLOVE) ×1
GLOVE BIOGEL PI IND STRL 6.5 (GLOVE) IMPLANT
GLOVE BIOGEL PI IND STRL 7.0 (GLOVE) IMPLANT
GLOVE BIOGEL PI IND STRL 8 (GLOVE) ×2 IMPLANT
GLOVE BIOGEL PI INDICATOR 6.5 (GLOVE) ×2
GLOVE BIOGEL PI INDICATOR 7.0 (GLOVE) ×2
GLOVE BIOGEL PI INDICATOR 8 (GLOVE) ×2
GLOVE ECLIPSE 8.0 STRL XLNG CF (GLOVE) ×4 IMPLANT
GLOVE SURG SS PI 6.5 STRL IVOR (GLOVE) ×2 IMPLANT
GOWN STRL REUS W/ TWL LRG LVL3 (GOWN DISPOSABLE) ×4 IMPLANT
GOWN STRL REUS W/TWL LRG LVL3 (GOWN DISPOSABLE) ×8
HEMOSTAT ARISTA ABSORB 3G PWDR (HEMOSTASIS) IMPLANT
HEMOSTAT SNOW SURGICEL 2X4 (HEMOSTASIS) IMPLANT
KIT CVR 48X5XPRB PLUP LF (MISCELLANEOUS) ×2 IMPLANT
KIT MARKER MARGIN INK (KITS) ×4 IMPLANT
KIT PORT POWER 8FR ISP CVUE (Port) ×2 IMPLANT
NDL HYPO 25X1 1.5 SAFETY (NEEDLE) ×2 IMPLANT
NEEDLE HYPO 25X1 1.5 SAFETY (NEEDLE) ×4 IMPLANT
NS IRRIG 1000ML POUR BTL (IV SOLUTION) ×4 IMPLANT
PENCIL SMOKE EVACUATOR (MISCELLANEOUS) ×4 IMPLANT
SET BASIN DAY SURGERY F.S. (CUSTOM PROCEDURE TRAY) ×4 IMPLANT
SLEEVE SCD COMPRESS KNEE MED (MISCELLANEOUS) ×4 IMPLANT
SPONGE LAP 4X18 RFD (DISPOSABLE) ×6 IMPLANT
SUT MNCRL AB 4-0 PS2 18 (SUTURE) ×4 IMPLANT
SUT MON AB 4-0 PC3 18 (SUTURE) ×4 IMPLANT
SUT PROLENE 2 0 SH DA (SUTURE) ×6 IMPLANT
SUT VICRYL 3-0 CR8 SH (SUTURE) ×4 IMPLANT
SYR 5ML LUER SLIP (SYRINGE) ×4 IMPLANT
SYR CONTROL 10ML LL (SYRINGE) ×4 IMPLANT
TOWEL GREEN STERILE FF (TOWEL DISPOSABLE) ×8 IMPLANT
TRAY FAXITRON CT DISP (TRAY / TRAY PROCEDURE) ×4 IMPLANT
TUBE CONNECTING 20'X1/4 (TUBING) ×1
TUBE CONNECTING 20X1/4 (TUBING) ×3 IMPLANT
YANKAUER SUCT BULB TIP NO VENT (SUCTIONS) ×4 IMPLANT

## 2020-02-20 NOTE — Transfer of Care (Signed)
Immediate Anesthesia Transfer of Care Note  Patient: Michelle [name]  Procedure(s) Performed: LEFT BREAST LUMPECTOMY X 2  WITH RADIOACTIVE SEED AND SENTINEL LYMPH NODE MAPPING (Left Breast) INSERTION PORT-A-CATH (Right Chest)  Patient Location: PACU  Anesthesia Type:GA combined with regional for post-op pain  Level of Consciousness: sedated  Airway & Oxygen Therapy: Patient Spontanous Breathing and Patient connected to face mask oxygen  Post-op Assessment: Report given to RN and Post -op Vital signs reviewed and stable  Post vital signs: Reviewed and stable  Last Vitals:  Vitals Value Taken Time  BP    Temp    Pulse 70 02/20/20 1301  Resp 10 02/20/20 1301  SpO2 98 % 02/20/20 1301  Vitals shown include unvalidated device data.  Last Pain:  Vitals:   02/20/20 1049  TempSrc:   PainSc: 0-No pain      Patients Stated Pain Goal: 3 ([id number])  Complications: No apparent anesthesia complications

## 2020-02-20 NOTE — Op Note (Signed)
Preoperative diagnosis: Stage I left breast cancer overlapping sites   and need for postoperative chemotherapy with port a cath  Postoperative diagnosis: Same  Procedure: Left breast seed lumpectomy x2 with left axillary sentinel lymph node mapping and placement of Portacath with ultrasound and C arm guidance  Surgeon: Turner Daniels, MD, FACS  Anesthesia: General and 0.25 % marcaine with epinephrine  Clinical History and Indications: The patient is getting ready to begin chemotherapy for her cancer. She  needs a Port-A-Cath for venous access. Risk of bleeding, infection,  Collapse lung,  Death,  DVT,  Organ injury,  Mediastinal injury,  Injury to heart,  Injury to blood vessels,  Nerves,  Migration of catheter,  Embolization of catheter and the need for more surgery.  She also is in need of left breast lumpectomy with sentinel lymph node mapping.  She is chosen breast conserving surgery after consultation with medical and radiation oncology and review of all of her surgical options to include mastectomy and reconstruction.The procedure has been discussed with the patient. Alternatives to surgery have been discussed with the patient.  Risks of surgery include bleeding,  Infection,  Seroma formation, death,  and the need for further surgery.   The patient understands and wishes to proceed.Sentinel lymph node mapping and dissection has been discussed with the patient.  Risk of bleeding,  Infection,  Seroma formation,  Additional procedures,,  Shoulder weakness ,  Shoulder stiffness,  Nerve and blood vessel injury and reaction to the mapping dyes have been discussed.  Alternatives to surgery have been discussed with the patient.  The patient agrees to proceed.  Description of Procedure: I have seen the patient in the holding area and confirmed the plans for the procedure as noted above. I reviewed the risks and complications again and the patient has no further questions.  Neoprobe used to check seed  location left breast x2.  She received a pectoral block on the left and injection of technetium sulfur colloid per protocol.  She wishes to proceed.   The patient was then taken to the operating room. After satisfactory general  anesthesia had been obtained the upper chest and lower neck were prepped and draped as a sterile field. The timeout was done.  The right internal jugular vein  was entered under U/S guidance  and the guidewire threaded into the superior vena cava right atrial area under fluoroscopic guidance. An incision was then made on the anterior chest wall and a subcutaneous pocket fashioned for the port reservoir.  The port tubing was then brought through a subcutaneous tunnel from the port site to the guidewire site.  The port and catheter were attached, locked  and flushed. The catheter was measured and cut to appropriate length.The dilator and peel-away sheath were then advanced over the guidewire while monitoring this with fluoroscopy. The guidewire and dilator were removed and the tubing threaded to approximately 22 cm. The peel-away sheath was then removed. The catheter aspirated and flushed easily. Using fluoroscopy the tip was in the superior vena cava right atrial junction area. It aspirated and flushed easily. That aspirated and flushed easily.  The reservoir was secured to the fascia with 1 sutures of 2-0 Prolene. A final check with fluoroscopy was done to make sure we had no kinks and good positioning of the tip of the catheter. Everything appeared to be okay. The catheter was aspirated, flushed with dilute heparin and then concentrated aqueous heparin.  The incision was then closed with interrupted 3-0 Vicryl,  and 4-0 Monocryl subcuticular with Dermabond on the skin.  The left breast was then prepped and draped in sterile fashion.  Timeout was performed again.  Neoprobe used to identify the seed locations with the help of the films the room in the medial left breast.  There  are 2 seeds.  These were marked.  Transverse incision was made over the medial breast.  Dissection was carried down and all tissue above both seeds and clip were excised with a grossly negative margin as 1 specimen.  These are in the medial upper and the most medial lower left breast overlapping sites.  Faxitron revealed both seeds and both clips to be in specimen.  The specimen was oriented with ink and sent to pathology.  The cavities made hemostatic with cautery and closed with 3-0 Vicryl and 4-0 Monocryl.  Neoprobe settings then changed to technetium.  Hotspot identified in left axilla.  Transverse incision was made in the lower left axilla.  Dissection was carried down in 3 hot sentinel nodes were identified and removed in the deep axillary lymph node basin.  These were passed the field.  Background counts approached 0.  Wound closed with 3-0 Vicryl for Monocryl.  Dermabond applied to all incisions.  All counts were found to be correct.  Patient was awoke extubated taken recovery in satisfactory condition.  Turner Daniels, MD, FACS

## 2020-02-20 NOTE — Anesthesia Procedure Notes (Signed)
Procedure Name: LMA Insertion Performed by: Akeel Reffner, [place], CRNA Pre-anesthesia Checklist: Patient identified, Emergency Drugs available, Suction available and Patient being monitored Patient Re-evaluated:Patient Re-evaluated prior to induction Oxygen Delivery Method: Circle system utilized Preoxygenation: Pre-oxygenation with 100% oxygen Induction Type: IV induction Ventilation: Mask ventilation without difficulty LMA: LMA inserted LMA Size: 4.0 Number of attempts: 1 Airway Equipment and Method: Bite block Placement Confirmation: positive ETCO2 Tube secured with: Tape Dental Injury: Teeth and Oropharynx as per pre-operative assessment        

## 2020-02-20 NOTE — Discharge Instructions (Signed)
[place] Office Phone Number [phone]  BREAST BIOPSY/ PARTIAL MASTECTOMY: POST OP INSTRUCTIONS  Always review your discharge instruction sheet given to you by the facility where your surgery was performed.  IF YOU HAVE DISABILITY OR FAMILY LEAVE FORMS, YOU MUST BRING THEM TO THE OFFICE FOR PROCESSING.  DO NOT GIVE THEM TO YOUR DOCTOR.  1. A prescription for pain medication may be given to you upon discharge.  Take your pain medication as prescribed, if needed.  If narcotic pain medicine is not needed, then you may take acetaminophen (Tylenol) or ibuprofen (Advil) as needed. 2. Take your usually prescribed medications unless otherwise directed 3. If you need a refill on your pain medication, please contact your pharmacy.  They will contact our office to request authorization.  Prescriptions will not be filled after 5pm or on week-ends. 4. You should eat very light the first 24 hours after surgery, such as soup, crackers, pudding, etc.  Resume your normal diet the day after surgery. 5. Most patients will experience some swelling and bruising in the breast.  Ice packs and a good support bra will help.  Swelling and bruising can take several days to resolve.  6. It is common to experience some constipation if taking pain medication after surgery.  Increasing fluid intake and taking a stool softener will usually help or prevent this problem from occurring.  A mild laxative (Milk of Magnesia or Miralax) should be taken according to package directions if there are no bowel movements after 48 hours. 7. Unless discharge instructions indicate otherwise, you may remove your bandages 24-48 hours after surgery, and you may shower at that time.  You may have steri-strips (small skin tapes) in place directly over the incision.  These strips should be left on the skin for 7-10 days.  If your surgeon used skin glue on the incision, you may shower in 24 hours.  The glue will flake off over the  next 2-3 weeks.  Any sutures or staples will be removed at the office during your follow-up visit. 8. ACTIVITIES:  You may resume regular daily activities (gradually increasing) beginning the next day.  Wearing a good support bra or sports bra minimizes pain and swelling.  You may have sexual intercourse when it is comfortable. a. You may drive when you no longer are taking prescription pain medication, you can comfortably wear a seatbelt, and you can safely maneuver your car and apply brakes. b. RETURN TO WORK:  ______________________________________________________________________________________ 9. You should see your doctor in the office for a follow-up appointment approximately two weeks after your surgery.  Your doctor's nurse will typically make your follow-up appointment when she calls you with your pathology report.  Expect your pathology report 2-3 business days after your surgery.  You may call to check if you do not hear from [place] after three days. 10. OTHER INSTRUCTIONS: _______________________________________________________________________________________________ _____________________________________________________________________________________________________________________________________ _____________________________________________________________________________________________________________________________________ _____________________________________________________________________________________________________________________________________  WHEN TO CALL YOUR DOCTOR: 1. Fever over 101.0 2. Nausea and/or vomiting. 3. Extreme swelling or bruising. 4. Continued bleeding from incision. 5. Increased pain, redness, or drainage from the incision.  The clinic staff is available to answer your questions during regular business hours.  Please don't hesitate to call and ask to speak to one of the nurses for clinical concerns.  If you have a medical emergency, go to the nearest  emergency room or call 911.  A surgeon from [facility] is always on call at the hospital.  For further questions, please visit [email or web address]  PORT-A-CATH: POST OP INSTRUCTIONS  Always review your discharge instruction sheet given to you by the facility where your surgery was performed.   1. A prescription for pain medication may be given to you upon discharge. Take your pain medication as prescribed, if needed. If narcotic pain medicine is not needed, then you make take acetaminophen (Tylenol) or ibuprofen (Advil) as needed.  2. Take your usually prescribed medications unless otherwise directed. 3. If you need a refill on your pain medication, please contact our office. All narcotic pain medicine now requires a paper prescription.  Phoned in and fax refills are no longer allowed by law.  Prescriptions will not be filled after 5 pm or on weekends.  4. You should follow a light diet for the remainder of the day after your procedure. 5. Most patients will experience some mild swelling and/or bruising in the area of the incision. It may take several days to resolve. 6. It is common to experience some constipation if taking pain medication after surgery. Increasing fluid intake and taking a stool softener (such as Colace) will usually help or prevent this problem from occurring. A mild laxative (Milk of Magnesia or Miralax) should be taken according to package directions if there are no bowel movements after 48 hours.  7. Unless discharge instructions indicate otherwise, you may remove your bandages 48 hours after surgery, and you may shower at that time. You may have steri-strips (small white skin tapes) in place directly over the incision.  These strips should be left on the skin for 7-10 days.  If your surgeon used Dermabond (skin glue) on the incision, you may shower in 24 hours.  The glue will flake off over the next 2-3 weeks.  8. If your port is left accessed at  the end of surgery (needle left in port), the dressing cannot get wet and should only by changed by a healthcare professional. When the port is no longer accessed (when the needle has been removed), follow step 7.   9. ACTIVITIES:  Limit activity involving your arms for the next 72 hours. Do no strenuous exercise or activity for 1 week. You may drive when you are no longer taking prescription pain medication, you can comfortably wear a seatbelt, and you can maneuver your car. 10.You may need to see your doctor in the office for a follow-up appointment.  Please       check with your doctor.  11.When you receive a new Port-a-Cath, you will get a product guide and        ID card.  Please keep them in case you need them.  WHEN TO CALL YOUR DOCTOR [phone]): 1. Fever over 101.0 2. Chills 3. Continued bleeding from incision 4. Increased redness and tenderness at the site 5. Shortness of breath, difficulty breathing   The clinic staff is available to answer your questions during regular business hours. Please don't hesitate to call and ask to speak to one of the nurses or medical assistants for clinical concerns. If you have a medical emergency, go to the nearest emergency room or call 911.  A surgeon from [facility] is always on call at the hospital.     For further information, please visit [email or web address]   Post Anesthesia Home Care Instructions  Activity: Get plenty of rest for the remainder of the day. A responsible individual must stay with you for 24 hours following the procedure.  For the next 24 hours, DO NOT: -Drive a car Film/video editor -  Drink alcoholic beverages -Take any medication unless instructed by your physician -Make any legal decisions or sign important papers.  Meals: Start with liquid foods such as gelatin or soup. Progress to regular foods as tolerated. Avoid greasy, spicy, heavy foods. If nausea and/or vomiting occur, drink only  clear liquids until the nausea and/or vomiting subsides. Call your physician if vomiting continues.  Special Instructions/Symptoms: Your throat may feel dry or sore from the anesthesia or the breathing tube placed in your throat during surgery. If this causes discomfort, gargle with warm salt water. The discomfort should disappear within 24 hours.  If you had a scopolamine patch placed behind your ear for the management of post- operative nausea and/or vomiting:  1. The medication in the patch is effective for 72 hours, after which it should be removed.  Wrap patch in a tissue and discard in the trash. Wash hands thoroughly with soap and water. 2. You may remove the patch earlier than 72 hours if you experience unpleasant side effects which may include dry mouth, dizziness or visual disturbances. 3. Avoid touching the patch. Wash your hands with soap and water after contact with the patch.

## 2020-02-20 NOTE — Anesthesia Procedure Notes (Signed)
Anesthesia Regional Block: Pectoralis block   Pre-Anesthetic Checklist: ,, timeout performed, Correct Patient, Correct Site, Correct Laterality, Correct Procedure, Correct Position, site marked, Risks and benefits discussed,  Surgical consent,  Pre-op evaluation,  At surgeon's request and post-op pain management  Laterality: Left  Prep: Maximum Sterile Barrier Precautions used, chloraprep       Needles:  Injection technique: Single-shot  Needle Type: Echogenic Stimulator Needle     Needle Length: 9cm  Needle Gauge: 22     Additional Needles:   Procedures:,,,, ultrasound used (permanent image in chart),,,,  Narrative:  Start time: 02/20/2020 10:14 AM End time: 02/20/2020 10:24 AM Injection made incrementally with aspirations every 5 mL.  Performed by: Personally  Anesthesiologist: Freddrick March, MD  Additional Notes: Monitors applied. No increased pain on injection. No increased resistance to injection. Injection made in 5cc increments. Good needle visualization. Patient tolerated procedure well.

## 2020-02-20 NOTE — Anesthesia Postprocedure Evaluation (Signed)
Anesthesia Post Note  Patient: LORIJEAN CLAES  Procedure(s) Performed: LEFT BREAST LUMPECTOMY X 2  WITH RADIOACTIVE SEED AND SENTINEL LYMPH NODE MAPPING (Left Breast) INSERTION PORT-A-CATH (Right Chest)     Patient location during evaluation: PACU Anesthesia Type: Regional and General Level of consciousness: awake and alert Pain management: pain level controlled Vital Signs Assessment: post-procedure vital signs reviewed and stable Respiratory status: spontaneous breathing, nonlabored ventilation, respiratory function stable and patient connected to nasal cannula oxygen Cardiovascular status: blood pressure returned to baseline and stable Postop Assessment: no apparent nausea or vomiting Anesthetic complications: no    Last Vitals:  Vitals:   02/20/20 1345 02/20/20 1400  BP: (!) 146/60 (!) 146/65  Pulse: 71 (!) 111  Resp: (!) 9 17  Temp:    SpO2: 98% 97%    Last Pain:  Vitals:   02/20/20 1407  TempSrc:   PainSc: 5                  Natasha Paulson L Jaevon Paras

## 2020-02-20 NOTE — Interval H&P Note (Signed)
History and Physical Interval Note:  02/20/2020 10:16 AM  Michelle [name]  has presented today for surgery, with the diagnosis of LEFT BREAST CANCER.  The various methods of treatment have been discussed with the patient and family. After consideration of risks, benefits and other options for treatment, the patient has consented to  Procedure(s) with comments: LEFT BREAST LUMPECTOMY X 2  WITH RADIOACTIVE SEED AND SENTINEL LYMPH NODE MAPPING (Left) - PEC BLOCK INSERTION PORT-A-CATH (Left) as a surgical intervention.  The patient's history has been reviewed, patient examined, no change in status, stable for surgery.  I have reviewed the patient's chart and labs.  Questions were answered to the patient's satisfaction.     [place]

## 2020-02-20 NOTE — Anesthesia Preprocedure Evaluation (Addendum)
Anesthesia Evaluation  Patient identified by MRN, date of birth, ID band Patient awake    Reviewed: Allergy & Precautions, NPO status , Patient's Chart, lab work & pertinent test results  Airway Mallampati: II  TM Distance: >3 FB Neck ROM: Full    Dental no notable dental hx. (+) Teeth Intact, Dental Advisory Given   Pulmonary neg pulmonary ROS, former smoker,    Pulmonary exam normal breath sounds clear to auscultation       Cardiovascular hypertension, Pt. on medications negative cardio ROS Normal cardiovascular exam Rhythm:Regular Rate:Normal  TTE 2021 EF 60-65%, mild LVH, valves ok   Neuro/Psych PSYCHIATRIC DISORDERS Anxiety negative neurological ROS     GI/Hepatic Neg liver ROS, GERD  ,  Endo/Other  Hypothyroidism   Renal/GU negative Renal ROS  negative genitourinary   Musculoskeletal  (+) Arthritis ,   Abdominal   Peds  Hematology negative hematology ROS (+)   Anesthesia Other Findings   Reproductive/Obstetrics                            Anesthesia Physical Anesthesia Plan  ASA: II  Anesthesia Plan: General and Regional   Post-op Pain Management:  Regional for Post-op pain   Induction: Intravenous  PONV Risk Score and Plan: 3 and Ondansetron, Dexamethasone and Treatment may vary due to age or medical condition  Airway Management Planned: LMA  Additional Equipment:   Intra-op Plan:   Post-operative Plan: Extubation in OR  Informed Consent: I have reviewed the patients History and Physical, chart, labs and discussed the procedure including the risks, benefits and alternatives for the proposed anesthesia with the patient or authorized representative who has indicated his/her understanding and acceptance.     Dental advisory given  Plan Discussed with: CRNA  Anesthesia Plan Comments:         Anesthesia Quick Evaluation

## 2020-02-20 NOTE — Progress Notes (Signed)
   Assisted Dr. Woodrum with left, ultrasound guided, pectoralis block. Side rails up, monitors on throughout procedure. See vital signs in flow sheet. Tolerated Procedure well. 

## 2020-02-21 ENCOUNTER — Encounter: Payer: Self-pay | Admitting: *Deleted
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
## 2020-02-24 ENCOUNTER — Other Ambulatory Visit: Payer: Self-pay

## 2020-02-24 ENCOUNTER — Inpatient Hospital Stay: Payer: Medicare Other | Attending: Oncology | Admitting: Oncology

## 2020-02-24 ENCOUNTER — Other Ambulatory Visit: Payer: Self-pay | Admitting: Oncology

## 2020-02-24 VITALS — BP 156/77 | HR 73 | Temp 99.1°F | Resp 18 | Ht 65.0 in | Wt 150.4 lb

## 2020-02-24 DIAGNOSIS — C50212 Malignant neoplasm of upper-inner quadrant of left female breast: Secondary | ICD-10-CM

## 2020-02-24 DIAGNOSIS — M8580 Other specified disorders of bone density and structure, unspecified site: Secondary | ICD-10-CM | POA: Diagnosis not present

## 2020-02-24 DIAGNOSIS — I700 Atherosclerosis of aorta: Secondary | ICD-10-CM | POA: Diagnosis not present

## 2020-02-24 DIAGNOSIS — Z79811 Long term (current) use of aromatase inhibitors: Secondary | ICD-10-CM | POA: Insufficient documentation

## 2020-02-24 DIAGNOSIS — K219 Gastro-esophageal reflux disease without esophagitis: Secondary | ICD-10-CM | POA: Diagnosis not present

## 2020-02-24 DIAGNOSIS — Z87891 Personal history of nicotine dependence: Secondary | ICD-10-CM | POA: Diagnosis not present

## 2020-02-24 DIAGNOSIS — Z170 Estrogen receptor positive status [ER+]: Secondary | ICD-10-CM

## 2020-02-24 DIAGNOSIS — Z79899 Other long term (current) drug therapy: Secondary | ICD-10-CM | POA: Insufficient documentation

## 2020-02-24 DIAGNOSIS — E079 Disorder of thyroid, unspecified: Secondary | ICD-10-CM | POA: Diagnosis not present

## 2020-02-24 DIAGNOSIS — I119 Hypertensive heart disease without heart failure: Secondary | ICD-10-CM | POA: Insufficient documentation

## 2020-02-24 DIAGNOSIS — E785 Hyperlipidemia, unspecified: Secondary | ICD-10-CM | POA: Insufficient documentation

## 2020-02-24 LAB — SURGICAL PATHOLOGY

## 2020-02-24 MED ORDER — LIDOCAINE-PRILOCAINE 2.5-2.5 % EX CREA: TOPICAL_CREAM | CUTANEOUS | 3 refills | Status: DC

## 2020-02-24 MED ORDER — ANASTROZOLE 1 MG PO TABS
1.0000 mg | ORAL_TABLET | Freq: Every day | ORAL | 4 refills | Status: DC
Start: 2020-02-24 — End: 2021-02-24

## 2020-02-25 ENCOUNTER — Encounter: Payer: Self-pay | Admitting: *Deleted

## 2020-02-25 ENCOUNTER — Telehealth: Payer: Self-pay | Admitting: Oncology

## 2020-02-25 NOTE — Telephone Encounter (Signed)
Scheduled appts per 5/17 los. Pt confirmed appt dates and time. Put message in next appt note for pt to get appt calendar of next visit per pt's request.

## 2020-02-28 ENCOUNTER — Other Ambulatory Visit: Payer: Self-pay | Admitting: *Deleted

## 2020-02-28 DIAGNOSIS — Z170 Estrogen receptor positive status [ER+]: Secondary | ICD-10-CM

## 2020-02-28 DIAGNOSIS — Z79811 Long term (current) use of aromatase inhibitors: Secondary | ICD-10-CM

## 2020-03-04 ENCOUNTER — Other Ambulatory Visit: Payer: Self-pay | Admitting: *Deleted

## 2020-03-04 DIAGNOSIS — Z170 Estrogen receptor positive status [ER+]: Secondary | ICD-10-CM

## 2020-03-06 ENCOUNTER — Other Ambulatory Visit: Payer: Self-pay | Admitting: *Deleted

## 2020-03-10 ENCOUNTER — Inpatient Hospital Stay: Payer: Medicare Other

## 2020-03-10 ENCOUNTER — Other Ambulatory Visit: Payer: Self-pay

## 2020-03-10 ENCOUNTER — Inpatient Hospital Stay: Payer: Medicare Other | Attending: Oncology

## 2020-03-10 DIAGNOSIS — C50212 Malignant neoplasm of upper-inner quadrant of left female breast: Secondary | ICD-10-CM | POA: Insufficient documentation

## 2020-03-10 DIAGNOSIS — Z170 Estrogen receptor positive status [ER+]: Secondary | ICD-10-CM | POA: Insufficient documentation

## 2020-03-10 DIAGNOSIS — Z5112 Encounter for antineoplastic immunotherapy: Secondary | ICD-10-CM | POA: Insufficient documentation

## 2020-03-10 NOTE — Progress Notes (Signed)
Nutrition Assessment  Reason for Assessment:  Pt identified by attending Breast Clinic.  Patient to receive herceptin.    ASSESSMENT:  86 year old female with triple positive breast cancer.  Patient s/p lumpectomy on 5/13.  Planning to start herceptin on 6/3.  Spoke with patient via phone.  Patient reports that her appetite was done for a few days following surgery but has been coming back.  Reports that she lives at [facility] and has all meals available to her.  Patient reports that she eats a variety of healthy foods.  Does not eat much beef.    Medications:  reviewed  Labs: reviewed  Anthropometrics:   Height: 65 inches Weight: 150 lb 5/17 BMI: 25 Noted weight 4/21 162 lb.  Patient reports that she does not know her UBW. Does not feel like she has lost weight in clothing.     NUTRITION DIAGNOSIS: Food and nutrition related knowledge deficit related to new diagnosis of breast cancer as evidenced by no prior need for nutrition related information.  INTERVENTION:   Discussed nutritional tips for breast cancer patients and importance of weight maintenance during treatment.  Encouraged good sources of protein. Provided examples of foods that contain protein.  Patient was able to write these foods down.  Contact information provided and patient knows to contact me with questions/concerns.    MONITORING, EVALUATION, and GOAL: Pt will consume a healthy plant based diet to maintain lean body mass throughout treatment.    Next Visit: June 24th during infusion    Jeremyah Jelley B. Zenia Resides, [place], [place] Registered Dietitian [phone] (pager)

## 2020-03-11 ENCOUNTER — Other Ambulatory Visit: Payer: Self-pay | Admitting: Oncology

## 2020-03-12 ENCOUNTER — Encounter: Payer: Self-pay | Admitting: *Deleted

## 2020-03-12 ENCOUNTER — Encounter: Payer: Self-pay | Admitting: Oncology

## 2020-03-12 ENCOUNTER — Inpatient Hospital Stay: Payer: Medicare Other

## 2020-03-12 ENCOUNTER — Other Ambulatory Visit: Payer: Self-pay

## 2020-03-12 VITALS — BP 145/61 | HR 76 | Temp 97.8°F | Resp 18 | Wt 158.5 lb

## 2020-03-12 DIAGNOSIS — C50212 Malignant neoplasm of upper-inner quadrant of left female breast: Secondary | ICD-10-CM | POA: Diagnosis present

## 2020-03-12 DIAGNOSIS — Z5112 Encounter for antineoplastic immunotherapy: Secondary | ICD-10-CM | POA: Diagnosis present

## 2020-03-12 DIAGNOSIS — Z170 Estrogen receptor positive status [ER+]: Secondary | ICD-10-CM | POA: Diagnosis not present

## 2020-03-12 LAB — COMPREHENSIVE METABOLIC PANEL
ALT: 22 U/L (ref 0–44)
AST: 22 U/L (ref 15–41)
Albumin: 3.6 g/dL (ref 3.5–5.0)
Alkaline Phosphatase: 63 U/L (ref 38–126)
Anion gap: 9 (ref 5–15)
BUN: 13 mg/dL (ref 8–23)
CO2: 28 mmol/L (ref 22–32)
Calcium: 9.1 mg/dL (ref 8.9–10.3)
Chloride: 105 mmol/L (ref 98–111)
Creatinine, Ser: 0.79 mg/dL (ref 0.44–1.00)
GFR calc Af Amer: >60 mL/min (ref >60)
GFR calc non Af Amer: >60 mL/min (ref >60)
Glucose, Bld: 91 mg/dL (ref 70–99)
Potassium: 4.1 mmol/L (ref 3.5–5.1)
Sodium: 142 mmol/L (ref 135–145)
Total Bilirubin: 0.5 mg/dL (ref 0.3–1.2)
Total Protein: 6.2 g/dL — ABNORMAL LOW (ref 6.5–8.1)

## 2020-03-12 LAB — CBC WITH DIFFERENTIAL/PLATELET
Abs Immature Granulocytes: 0 10*3/uL (ref 0.00–0.07)
Basophils Absolute: 0 10*3/uL (ref 0.0–0.1)
Basophils Relative: 0 %
Eosinophils Absolute: 0.1 10*3/uL (ref 0.0–0.5)
Eosinophils Relative: 2 %
HCT: 37.9 % (ref 36.0–46.0)
Hemoglobin: 12.1 g/dL (ref 12.0–15.0)
Immature Granulocytes: 0 %
Lymphocytes Relative: 25 %
Lymphs Abs: 1.2 10*3/uL (ref 0.7–4.0)
MCH: 30.2 pg (ref 26.0–34.0)
MCHC: 31.9 g/dL (ref 30.0–36.0)
MCV: 94.5 fL (ref 80.0–100.0)
Monocytes Absolute: 0.5 10*3/uL (ref 0.1–1.0)
Monocytes Relative: 11 %
Neutro Abs: 2.9 10*3/uL (ref 1.7–7.7)
Neutrophils Relative %: 62 %
Platelets: 142 10*3/uL — ABNORMAL LOW (ref 150–400)
RBC: 4.01 MIL/uL (ref 3.87–5.11)
RDW: 12.3 % (ref 11.5–15.5)
WBC: 4.7 10*3/uL (ref 4.0–10.5)
nRBC: 0 % (ref 0.0–0.2)

## 2020-03-12 MED ORDER — DIPHENHYDRAMINE HCL 50 MG/ML IJ SOLN
12.5000 mg | Freq: Once | INTRAMUSCULAR | Status: AC
  Administered 2020-03-12: 12.5 mg via INTRAVENOUS

## 2020-03-12 MED ORDER — DIPHENHYDRAMINE HCL 50 MG/ML IJ SOLN
INTRAMUSCULAR | Status: AC
  Filled 2020-03-12: qty 1

## 2020-03-12 MED ORDER — ACETAMINOPHEN 325 MG PO TABS
650.0000 mg | ORAL_TABLET | Freq: Once | ORAL | Status: AC
  Administered 2020-03-12: 650 mg via ORAL

## 2020-03-12 MED ORDER — ACETAMINOPHEN 325 MG PO TABS
ORAL_TABLET | ORAL | Status: AC
  Filled 2020-03-12: qty 2

## 2020-03-12 MED ORDER — HEPARIN SOD (PORK) LOCK FLUSH 100 UNIT/ML IV SOLN
500.0000 [IU] | Freq: Once | INTRAVENOUS | Status: AC | PRN
  Administered 2020-03-12: 500 [IU]
  Filled 2020-03-12: qty 5

## 2020-03-12 MED ORDER — SODIUM CHLORIDE 0.9% FLUSH
10.0000 mL | INTRAVENOUS | Status: DC | PRN
  Administered 2020-03-12 (×2): 10 mL via INTRAVENOUS
  Filled 2020-03-12: qty 10

## 2020-03-12 MED ORDER — SODIUM CHLORIDE 0.9 % IV SOLN
Freq: Once | INTRAVENOUS | Status: AC
  Filled 2020-03-12: qty 250

## 2020-03-12 MED ORDER — SODIUM CHLORIDE 0.9% FLUSH
10.0000 mL | INTRAVENOUS | Status: DC | PRN
  Filled 2020-03-12: qty 10

## 2020-03-12 MED ORDER — TRASTUZUMAB-ANNS CHEMO 150 MG IV SOLR
420.0000 mg | Freq: Once | INTRAVENOUS | Status: AC
  Administered 2020-03-12: 420 mg via INTRAVENOUS
  Filled 2020-03-12: qty 20

## 2020-03-12 NOTE — Progress Notes (Signed)
First dose Kanjinti confirmed with Dr. Jana Hakim - leave at 6 mg/kg.   Michelle [name], PharmD, BCPS, [facility] Pharmacist Pharmacy Phone: [phone] 03/12/2020

## 2020-03-12 NOTE — Progress Notes (Signed)
Met w/ pt to introduce myself as her Arboriculturist.  Unfortunately there aren't any foundations offering copay assistance for her Dx and the type of ins she has.  I offered the [employer], went over what it covers and gave her an expense sheet.  Pt declined stating she doesn't need assistance w/ personal expenses at this time.  I gave her my card in case she changes her mind and for any questions or concerns she may have in the future.

## 2020-03-12 NOTE — Patient Instructions (Addendum)
Trastuzumab injection for infusion What is this medicine? TRASTUZUMAB (tras TOO zoo mab) is a monoclonal antibody. It is used to treat breast cancer and stomach cancer. This medicine may be used for other purposes; ask your health care provider or pharmacist if you have questions. COMMON BRAND NAME(S): Herceptin, Herzuma, KANJINTI, Ogivri, Ontruzant, Trazimera What should I tell my health care provider before I take this medicine? They need to know if you have any of these conditions:  heart disease  heart failure  lung or breathing disease, like asthma  an unusual or allergic reaction to trastuzumab, benzyl alcohol, or other medications, foods, dyes, or preservatives  pregnant or trying to get pregnant  breast-feeding How should I use this medicine? This drug is given as an infusion into a vein. It is administered in a hospital or clinic by a specially trained health care professional. Talk to your pediatrician regarding the use of this medicine in children. This medicine is not approved for use in children. Overdosage: If you think you have taken too much of this medicine contact a poison control center or emergency room at once. NOTE: This medicine is only for you. Do not share this medicine with others. What if I miss a dose? It is important not to miss a dose. Call your doctor or health care professional if you are unable to keep an appointment. What may interact with this medicine? This medicine may interact with the following medications:  certain types of chemotherapy, such as daunorubicin, doxorubicin, epirubicin, and idarubicin This list may not describe all possible interactions. Give your health care provider a list of all the medicines, herbs, non-prescription drugs, or dietary supplements you use. Also tell them if you smoke, drink alcohol, or use illegal drugs. Some items may interact with your medicine. What should I watch for while using this medicine? Visit your  doctor for checks on your progress. Report any side effects. Continue your course of treatment even though you feel ill unless your doctor tells you to stop. Call your doctor or health care professional for advice if you get a fever, chills or sore throat, or other symptoms of a cold or flu. Do not treat yourself. Try to avoid being around people who are sick. You may experience fever, chills and shaking during your first infusion. These effects are usually mild and can be treated with other medicines. Report any side effects during the infusion to your health care professional. Fever and chills usually do not happen with later infusions. Do not become pregnant while taking this medicine or for 7 months after stopping it. Women should inform their doctor if they wish to become pregnant or think they might be pregnant. Women of child-bearing potential will need to have a negative pregnancy test before starting this medicine. There is a potential for serious side effects to an unborn child. Talk to your health care professional or pharmacist for more information. Do not breast-feed an infant while taking this medicine or for 7 months after stopping it. Women must use effective birth control with this medicine. What side effects may I notice from receiving this medicine? Side effects that you should report to your doctor or health care professional as soon as possible:  allergic reactions like skin rash, itching or hives, swelling of the face, lips, or tongue  chest pain or palpitations  cough  dizziness  feeling faint or lightheaded, falls  fever  general ill feeling or flu-like symptoms  signs of worsening heart failure like   breathing problems; swelling in your legs and feet  unusually weak or tired Side effects that usually do not require medical attention (report to your doctor or health care professional if they continue or are bothersome):  bone pain  changes in  taste  diarrhea  joint pain  nausea/vomiting  weight loss This list may not describe all possible side effects. Call your doctor for medical advice about side effects. You may report side effects to FDA at 1-800-FDA-1088. Where should I keep my medicine? This drug is given in a hospital or clinic and will not be stored at home. NOTE: This sheet is a summary. It may not cover all possible information. If you have questions about this medicine, talk to your doctor, pharmacist, or health care provider.  2020 Elsevier/Gold Standard (2016-09-20 14:37:52)  

## 2020-03-16 NOTE — Progress Notes (Signed)
Location of Breast Cancer: Malignant neoplasm of upper-inner quadrant of LEFT breast, estrogen receptor positive  Histology per Pathology Report:  02/20/2020 FINAL MICROSCOPIC DIAGNOSIS:  A. BREAST, LEFT W/SEED, LUMPECTOMY:  - Invasive ductal carcinoma, grade 3, 1.8 cm  - Ductal carcinoma in situ, high-grade  - Resection margins are negative for carcinoma; closest are the lateral  margin at less than 1 mm, the anterior margin at 1.5 mm in the posterior  margin of 3 mm  - Lymphovascular invasion is not identified  - Intraductal papilloma, 0.8 cm  - Biopsy site changes  - See oncology table  B. SENTINEL LYMPH NODE, LEFT AXILLARY, BIOPSY:  - Lymph node, negative for carcinoma (0/1)  C. SENTINEL LYMPH NODE, LEFT, BIOPSY:  - Lymph node, negative for carcinoma  Receptor Status: ER(100%), PR (5%), Her2-neu (positive), Ki-67(80%)  Did patient present with symptoms (if so, please note symptoms) or was this found on screening mammography?:  Patient palpated a lump in her left breast. She underwent bilateral diagnostic mammography with tomography and left breast ultrasonography at [facility] on 12/31/2019 showing: breast density category C; 1.6 cm irregular mass in left breast at 10-11 o'clock; 0.8 cm oval mass in left breast at 10 o'clock; no significant left axillary abnormalities.  Past/Anticipated interventions by surgeon, if any: 02/20/2020 Dr. Marcello Moores Cornett Left breast seed lumpectomy x2 with left axillary sentinel lymph node mapping  Past/Anticipated interventions by medical oncology, if any:  Under care of Dr. Lurline Del (1) definitive surgery 02/20/2020 (2) adjuvant trastuzumab for 6 months, first dose 03/12/2020, with close echocardiographic follow-up (every 2 months) (3) anastrozole started 02/24/2020             (a) bone density (4) adjuvant radiation pending  Lymphedema issues, if any: None    Pain issues, if any:  Patient denies   SAFETY ISSUES:  Prior radiation?  No  Pacemaker/ICD? No  Possible current pregnancy? No--abdominal hysterectomy 1971  Is the patient on methotrexate? No  Current Complaints / other details:  Nothing of note    Zola Button, RN 03/16/2020,9:27 AM

## 2020-03-17 ENCOUNTER — Encounter: Payer: Self-pay | Admitting: Licensed Clinical Social Worker

## 2020-03-17 ENCOUNTER — Telehealth: Payer: Self-pay

## 2020-03-17 ENCOUNTER — Encounter: Payer: Self-pay | Admitting: Radiation Oncology

## 2020-03-17 ENCOUNTER — Ambulatory Visit
Admission: RE | Admit: 2020-03-17 | Discharge: 2020-03-17 | Disposition: A | Discharge status: Home / Self Care | Payer: Medicare Other | Source: Ambulatory Visit | Attending: Radiation Oncology | Admitting: Radiation Oncology

## 2020-03-17 ENCOUNTER — Other Ambulatory Visit: Payer: Self-pay

## 2020-03-17 VITALS — BP 161/66 | HR 69 | Temp 98.0°F | Resp 20 | Ht 65.0 in | Wt 158.2 lb

## 2020-03-17 DIAGNOSIS — Z170 Estrogen receptor positive status [ER+]: Secondary | ICD-10-CM

## 2020-03-17 DIAGNOSIS — C50212 Malignant neoplasm of upper-inner quadrant of left female breast: Secondary | ICD-10-CM

## 2020-03-17 NOTE — Telephone Encounter (Signed)
Pt voiced concerns for echocardiogram scheduled at the [facility] location today while in rad/onc. Called pt and LVM to inform her that her echocardiogram appt has been changed to 05/12/20 at 1:00 pm at the [facility] location. Gave pt instructions to return call with any questions to [phone].

## 2020-03-17 NOTE — Progress Notes (Addendum)
Radiation Oncology         [phone] ________________________________  Name: Michelle [name] MRN: [id number]  Date: 03/17/2020  DOB: 1931/06/07  Follow-Up Visit Note  Outpatient  CC: Michelle Morning, DO  Michelle [name], Michelle Dad, MD  Diagnosis:      ICD-10-CM   1. Malignant neoplasm of upper-inner quadrant of left breast in female, estrogen receptor positive ([place])  C50.212 Ambulatory referral to Social Work   Z17.0      Cancer Staging Malignant neoplasm of upper-inner quadrant of left breast in female, estrogen receptor positive ([place]) Staging form: Breast, AJCC 8th Edition - Clinical stage from 01/29/2020: Stage IA (cT1c, cN0, cM0, G3, ER+, PR+, HER2+) - Signed by Michelle Gibson, MD on 01/29/2020   CHIEF COMPLAINT: Here to discuss management of left breast cancer  Narrative:  The patient returns today for follow-up to discuss radiation treatment options. She was seen in the multidisciplinary breast clinic on 01/29/2020.     She opted to proceed with left lumpectomy on date of 02/20/20 with pathology report revealing: tumor size of 1.8 cm; histology of ductal carcinoma; margin status to invasive disease of negative and margin status to in situ disease of negative; nodal status of negative (0/2); Grade 3. A 0.8 cm intraductal papilloma was also removed.  She started anastrozole on 02/24/20 and trastuzumab on 03/12/20 under Dr. Jana [name].  Lymphedema issues, if any: None    Pain issues, if any:  Patient denies   SAFETY ISSUES:  Prior radiation? No  Pacemaker/ICD? No  Possible current pregnancy? No--age AND abdominal hysterectomy 1971  Is the patient on methotrexate? No         ALLERGIES:  is allergic to atorvastatin and pravastatin.  Meds: Current Outpatient Medications  Medication Sig Dispense Refill  . ALPRAZolam (XANAX) 0.25 MG tablet Take 0.25 mg by mouth 2 (two) times daily as needed for anxiety.  0  . anastrozole (ARIMIDEX) 1 MG tablet Take 1 tablet (1 mg total) by mouth  daily. 90 tablet 4  . aspirin 81 MG tablet Take 81 mg by mouth at bedtime.     . Calcium Carbonate-Vitamin D (CALCIUM + D PO) Take 600 mg by mouth 2 (two) times daily.     . Carboxymethylcellulose Sodium (EYE DROPS OP) Apply 1-2 drops to eye daily as needed (dry eye).    Marland Kitchen esomeprazole (NEXIUM) 20 MG capsule Take 20 mg by mouth daily.    Marland Kitchen lidocaine-prilocaine (EMLA) cream Apply to affected area once 30 g 3  . losartan (COZAAR) 25 MG tablet losartan 25 mg tablet    . meclizine (ANTIVERT) 12.5 MG tablet Take 1 tablet (12.5 mg total) by mouth 3 (three) times daily as needed for dizziness. 15 tablet 0  . Multiple Vitamins-Minerals (CENTRUM SILVER PO) Take 1 tablet by mouth daily.    Marland Kitchen SYNTHROID 75 MCG tablet Take 75 mcg by mouth Daily.    Marland Kitchen acetaminophen (TYLENOL) 500 MG tablet Take 1 tablet (500 mg total) by mouth every 6 (six) hours as needed. (Patient not taking: Reported on 01/29/2020) 30 tablet 0  . phenazopyridine (PYRIDIUM) 200 MG tablet Take 1 tablet (200 mg total) by mouth 3 (three) times daily. (Patient not taking: Reported on 01/29/2020) 6 tablet 0   No current facility-administered medications for this encounter.    Physical Findings:  height is 5' 5"  (1.651 m) and weight is 158 lb 4 oz (71.8 kg). Her temporal temperature is 98 F (36.7 C). Her blood pressure is 161/66 (abnormal) and  her pulse is 69. Her respiration is 20 and oxygen saturation is 96%. .     General: Alert and oriented, in no acute distress Extremities: No UE edema. Musculoskeletal: ambulatory Neurologic: No obvious focalities. Speech is fluent.  Psychiatric: Judgment and insight are intact. Affect is appropriate. Breast exam reveals healing lumpectomy scar in inner left breast.  There is some concavity to her breast secondary to lumpectomy.  Axillary scar is also healing.  Lab Findings: Lab Results  Component Value Date   WBC 4.7 03/12/2020   HGB 12.1 03/12/2020   HCT 37.9 03/12/2020   MCV 94.5 03/12/2020    PLT 142 (L) 03/12/2020    Radiographic Findings: NM Sentinel Node Inj-No Rpt (Breast)  Result Date: 02/20/2020 Sulfur colloid was injected by the nuclear medicine technologist for melanoma sentinel node.   DG Chest Port 1 View  Result Date: 02/20/2020 CLINICAL DATA:  Port-A-Cath placement. EXAM: PORTABLE CHEST 1 VIEW COMPARISON:  None. FINDINGS: A right jugular Port-A-Cath terminates over the lower SVC. The cardiomediastinal silhouette is within normal limits for portable AP technique. Aortic atherosclerosis is noted. The lungs are hypoinflated with mild left greater than right basilar lung opacities. No sizable pleural effusion or pneumothorax is identified. Surgical clips and gas project over the left breast consistent with recent lumpectomy. No acute osseous abnormality is seen. IMPRESSION: 1. Port-A-Cath as above. 2. Low lung volumes with left greater than right basilar opacities, likely atelectasis. Electronically Signed   By: Logan Bores M.D.   On: 02/20/2020 13:56   DG Fluoro Guide CV Line-No Report  Result Date: 02/20/2020 Fluoroscopy was utilized by the requesting physician.  No radiographic interpretation.    Impression/Plan: Left Breast Cancer  We discussed adjuvant radiotherapy today.  I recommend 4 weeks directed at the left breast in order to reduce the risk of locoregional recurrence by 2/3.  The risks, benefits and side effects of this treatment were discussed in detail.  She understands that radiotherapy is associated with skin irritation and fatigue in the acute setting. Late effects can include cosmetic changes and rare injury to internal organs.  She is enthusiastic about proceeding with treatment.  She understands that treatment is optional and it will not improve her life expectancy.  However will provide a modest decrease in the risk of an in breast recurrence.  She would like to proceed.  She understands that radiotherapy can take place at the same time as trastuzumab.  We  will give her a couple more weeks to heal and bring her in at the end of the month for treatment planning.  A consent form has been signed and placed in her chart.  A total of 3 medically necessary complex treatment devices will be fabricated and supervised by me: 2 fields with MLCs for custom blocks to protect heart, and lungs;  and, a Vac-lok. MORE COMPLEX DEVICES MAY BE MADE IN DOSIMETRY FOR FIELD IN FIELD BEAMS FOR DOSE HOMOGENEITY.  I have requested : 3D Simulation which is medically necessary to give adequate dose to at risk tissues while sparing lungs and heart.  I have requested a DVH of the following structures: lungs, heart, left lumpectomy cavity.    The patient will receive 40.05 Gy in 15 fractions to the left breast with 2 fields.  This will be followed by a boost.  She is concerned about transportation so I will refer her to social work.  On date of service, in total, I spent 30 minutes on this encounter.  The  patient was seen in person.    _____________________________________   Michelle Gibson, MD   This document serves as a record of services personally performed by Michelle Gibson, MD. It was created on her behalf by Wilburn Mylar, a trained medical scribe. The creation of this record is based on the scribe's personal observations and the provider's statements to them. This document has been checked and approved by the attending provider.

## 2020-03-17 NOTE — Progress Notes (Signed)
[place] Work  Clinical Social Work was referred by Dr. Isidore Moos for assessment of transportation needs.  Clinical Social Worker contacted patient by phone  to offer support and assess for needs.  Patient reports that she drives and can usually get herself to all appointments. She only has a worry that if she needs to drive across town (towards [place]) that she would have trouble or if she is not feeling well, that she might not be able to find someone same-day to drive her. CSW suggested calling her insurance to determine if she has transportation benefit. Can also look into [facility] transportation or [facility] [place], but all would require notice for a ride.  Patient voiced understanding and stated she will call her insurance. She will reach back out to this CSW if needed.      Michelle [name], [place], [place] Worker [facility]

## 2020-03-24 ENCOUNTER — Encounter: Payer: Self-pay | Admitting: *Deleted

## 2020-03-26 ENCOUNTER — Encounter: Payer: Self-pay | Admitting: Physical Therapy

## 2020-03-26 ENCOUNTER — Ambulatory Visit: Payer: Medicare Other | Attending: Surgery | Admitting: Physical Therapy

## 2020-03-26 ENCOUNTER — Other Ambulatory Visit: Payer: Self-pay

## 2020-03-26 DIAGNOSIS — Z170 Estrogen receptor positive status [ER+]: Secondary | ICD-10-CM | POA: Diagnosis present

## 2020-03-26 DIAGNOSIS — R293 Abnormal posture: Secondary | ICD-10-CM | POA: Diagnosis present

## 2020-03-26 DIAGNOSIS — Z483 Aftercare following surgery for neoplasm: Secondary | ICD-10-CM | POA: Insufficient documentation

## 2020-03-26 DIAGNOSIS — C50212 Malignant neoplasm of upper-inner quadrant of left female breast: Secondary | ICD-10-CM | POA: Diagnosis not present
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
## 2020-03-26 NOTE — Patient Instructions (Addendum)
            [facility]         [address], [place] [address]         [id number]         Annia Friendly, PT, CLT   After Breast Cancer Class I will give you the information in writing to learn how to prevent lymphedema (arm swelling).  Scar massage Begin using coconut oil on her left breast and armpit incisions to decease the hardness of the scars. You can gently rub those areas twice a day. You may need to use a different cream once you start radiation but they will give you that cream in radiation.   Home exercise Program Continue doing the exercises I gave you to keep your shoulders moving well.   Follow up PT: You don't need to come back to PT. The L-Dex machine we did that measured your arm fluid is supposed ot be done every months, but because coming to this location is difficult for you, I do not think it's necessary. You are very low risk for lymphedema (arm swelling). Please call me if you think your arm is beginning to swell.

## 2020-03-30 ENCOUNTER — Ambulatory Visit
Admission: RE | Admit: 2020-03-30 | Discharge: 2020-03-30 | Disposition: A | Discharge status: Home / Self Care | Payer: Medicare Other | Source: Ambulatory Visit | Attending: Radiation Oncology | Admitting: Radiation Oncology

## 2020-03-30 ENCOUNTER — Other Ambulatory Visit: Payer: Self-pay

## 2020-03-30 DIAGNOSIS — C50212 Malignant neoplasm of upper-inner quadrant of left female breast: Secondary | ICD-10-CM | POA: Insufficient documentation

## 2020-03-30 DIAGNOSIS — Z170 Estrogen receptor positive status [ER+]: Secondary | ICD-10-CM | POA: Insufficient documentation

## 2020-03-30 DIAGNOSIS — Z510 Encounter for antineoplastic radiation therapy: Secondary | ICD-10-CM | POA: Insufficient documentation

## 2020-03-31 DIAGNOSIS — Z510 Encounter for antineoplastic radiation therapy: Secondary | ICD-10-CM | POA: Diagnosis not present

## 2020-04-02 ENCOUNTER — Inpatient Hospital Stay: Payer: Medicare Other

## 2020-04-02 ENCOUNTER — Inpatient Hospital Stay: Payer: Medicare Other | Admitting: Nutrition

## 2020-04-02 ENCOUNTER — Other Ambulatory Visit: Payer: Self-pay | Admitting: Oncology

## 2020-04-02 ENCOUNTER — Other Ambulatory Visit: Payer: Self-pay | Admitting: *Deleted

## 2020-04-02 ENCOUNTER — Other Ambulatory Visit: Payer: Self-pay

## 2020-04-02 VITALS — BP 169/68 | HR 73 | Temp 98.2°F | Resp 22

## 2020-04-02 DIAGNOSIS — Z170 Estrogen receptor positive status [ER+]: Secondary | ICD-10-CM

## 2020-04-02 DIAGNOSIS — Z95828 Presence of other vascular implants and grafts: Secondary | ICD-10-CM

## 2020-04-02 DIAGNOSIS — Z5112 Encounter for antineoplastic immunotherapy: Secondary | ICD-10-CM | POA: Diagnosis not present

## 2020-04-02 DIAGNOSIS — C50212 Malignant neoplasm of upper-inner quadrant of left female breast: Secondary | ICD-10-CM

## 2020-04-02 LAB — CBC WITH DIFFERENTIAL/PLATELET
Abs Immature Granulocytes: 0.02 10*3/uL (ref 0.00–0.07)
Basophils Absolute: 0 10*3/uL (ref 0.0–0.1)
Basophils Relative: 1 %
Eosinophils Absolute: 0.1 10*3/uL (ref 0.0–0.5)
Eosinophils Relative: 2 %
HCT: 40.3 % (ref 36.0–46.0)
Hemoglobin: 13 g/dL (ref 12.0–15.0)
Immature Granulocytes: 0 %
Lymphocytes Relative: 22 %
Lymphs Abs: 1.4 10*3/uL (ref 0.7–4.0)
MCH: 30.3 pg (ref 26.0–34.0)
MCHC: 32.3 g/dL (ref 30.0–36.0)
MCV: 93.9 fL (ref 80.0–100.0)
Monocytes Absolute: 0.5 10*3/uL (ref 0.1–1.0)
Monocytes Relative: 8 %
Neutro Abs: 4.2 10*3/uL (ref 1.7–7.7)
Neutrophils Relative %: 67 %
Platelets: 132 10*3/uL — ABNORMAL LOW (ref 150–400)
RBC: 4.29 MIL/uL (ref 3.87–5.11)
RDW: 12.4 % (ref 11.5–15.5)
WBC: 6.3 10*3/uL (ref 4.0–10.5)
nRBC: 0 % (ref 0.0–0.2)

## 2020-04-02 LAB — COMPREHENSIVE METABOLIC PANEL
ALT: 25 U/L (ref 0–44)
AST: 26 U/L (ref 15–41)
Albumin: 3.8 g/dL (ref 3.5–5.0)
Alkaline Phosphatase: 75 U/L (ref 38–126)
Anion gap: 8 (ref 5–15)
BUN: 13 mg/dL (ref 8–23)
CO2: 29 mmol/L (ref 22–32)
Calcium: 9 mg/dL (ref 8.9–10.3)
Chloride: 103 mmol/L (ref 98–111)
Creatinine, Ser: 0.82 mg/dL (ref 0.44–1.00)
GFR calc Af Amer: >60 mL/min (ref >60)
GFR calc non Af Amer: >60 mL/min (ref >60)
Glucose, Bld: 92 mg/dL (ref 70–99)
Potassium: 4.5 mmol/L (ref 3.5–5.1)
Sodium: 140 mmol/L (ref 135–145)
Total Bilirubin: 0.6 mg/dL (ref 0.3–1.2)
Total Protein: 6.6 g/dL (ref 6.5–8.1)

## 2020-04-02 MED ORDER — TRASTUZUMAB-ANNS CHEMO 420 MG IV SOLR
420.0000 mg | Freq: Once | INTRAVENOUS | Status: DC
  Filled 2020-04-02: qty 20

## 2020-04-02 MED ORDER — ACETAMINOPHEN 325 MG PO TABS
ORAL_TABLET | ORAL | Status: AC
  Filled 2020-04-02: qty 2

## 2020-04-02 MED ORDER — DIPHENHYDRAMINE HCL 12.5 MG/5ML PO ELIX
ORAL_SOLUTION | ORAL | Status: AC
  Filled 2020-04-02: qty 5

## 2020-04-02 MED ORDER — SODIUM CHLORIDE 0.9% FLUSH
10.0000 mL | INTRAVENOUS | Status: DC | PRN
  Administered 2020-04-02: 10 mL via INTRAVENOUS
  Filled 2020-04-02: qty 10

## 2020-04-02 MED ORDER — ACETAMINOPHEN 325 MG PO TABS
650.0000 mg | ORAL_TABLET | Freq: Once | ORAL | Status: AC
  Administered 2020-04-02: 650 mg via ORAL

## 2020-04-02 MED ORDER — SODIUM CHLORIDE 0.9% FLUSH
10.0000 mL | INTRAVENOUS | Status: DC | PRN
  Administered 2020-04-02: 10 mL
  Filled 2020-04-02: qty 10

## 2020-04-02 MED ORDER — TRASTUZUMAB-ANNS CHEMO 150 MG IV SOLR
450.0000 mg | Freq: Once | INTRAVENOUS | Status: AC
  Administered 2020-04-02: 450 mg via INTRAVENOUS
  Filled 2020-04-02: qty 21.43

## 2020-04-02 MED ORDER — HEPARIN SOD (PORK) LOCK FLUSH 100 UNIT/ML IV SOLN
500.0000 [IU] | Freq: Once | INTRAVENOUS | Status: AC | PRN
  Administered 2020-04-02: 500 [IU]
  Filled 2020-04-02: qty 5

## 2020-04-02 MED ORDER — ALTEPLASE 2 MG IJ SOLR
INTRAMUSCULAR | Status: AC
  Filled 2020-04-02: qty 2

## 2020-04-02 MED ORDER — DIPHENHYDRAMINE HCL 12.5 MG/5ML PO ELIX
12.5000 mg | ORAL_SOLUTION | Freq: Once | ORAL | Status: AC
  Administered 2020-04-02: 12.5 mg via ORAL

## 2020-04-02 MED ORDER — SODIUM CHLORIDE 0.9 % IV SOLN
Freq: Once | INTRAVENOUS | Status: AC
  Filled 2020-04-02: qty 250

## 2020-04-02 MED ORDER — ALTEPLASE 2 MG IJ SOLR
2.0000 mg | Freq: Once | INTRAMUSCULAR | Status: AC | PRN
  Administered 2020-04-02: 2 mg
  Filled 2020-04-02: qty 2

## 2020-04-02 NOTE — Progress Notes (Signed)
Nutrition follow-up completed with patient during infusion for breast cancer. Last documented weight was 158.25 pounds on June 8 which is increased from 150 pounds May 17. Patient denies nutrition impact symptoms.  She has no questions or concerns.  Enforced importance of adequate calorie and protein intake for weight maintenance and encourage patient to contact RD with further questions or concerns.

## 2020-04-02 NOTE — Progress Notes (Signed)
Unable to get blood from port. cathflo administered by [employer]. Sent patient back to lab to get blood drawn from port.

## 2020-04-02 NOTE — Patient Instructions (Signed)
[facility] °Discharge Instructions for Patients Receiving Chemotherapy ° °Today you received the following chemotherapy agents Trastuzumab ° °To help prevent nausea and vomiting after your treatment, we encourage you to take your nausea medication as directed. °  °If you develop nausea and vomiting that is not controlled by your nausea medication, call the clinic.  ° °BELOW ARE SYMPTOMS THAT SHOULD BE REPORTED IMMEDIATELY: °· *FEVER GREATER THAN 100.5 F °· *CHILLS WITH OR WITHOUT FEVER °· NAUSEA AND VOMITING THAT IS NOT CONTROLLED WITH YOUR NAUSEA MEDICATION °· *UNUSUAL SHORTNESS OF BREATH °· *UNUSUAL BRUISING OR BLEEDING °· TENDERNESS IN MOUTH AND THROAT WITH OR WITHOUT PRESENCE OF ULCERS °· *URINARY PROBLEMS °· *BOWEL PROBLEMS °· UNUSUAL RASH °Items with * indicate a potential emergency and should be followed up as soon as possible. ° °Feel free to call the clinic should you have any questions or concerns. The clinic phone number is [phone]. ° °Please show the CHEMO ALERT CARD at check-in to the Emergency Department and triage nurse. ° ° °

## 2020-04-06 ENCOUNTER — Other Ambulatory Visit: Payer: Self-pay

## 2020-04-06 ENCOUNTER — Ambulatory Visit
Admission: RE | Admit: 2020-04-06 | Discharge: 2020-04-06 | Disposition: A | Discharge status: Home / Self Care | Payer: Medicare Other | Source: Ambulatory Visit | Attending: Radiation Oncology | Admitting: Radiation Oncology

## 2020-04-06 DIAGNOSIS — Z510 Encounter for antineoplastic radiation therapy: Secondary | ICD-10-CM | POA: Diagnosis not present

## 2020-04-06 DIAGNOSIS — Z170 Estrogen receptor positive status [ER+]: Secondary | ICD-10-CM

## 2020-04-06 MED ORDER — ALRA NON-METALLIC DEODORANT (RAD-ONC)
1.0000 "application " | Freq: Once | TOPICAL | Status: AC
  Administered 2020-04-06: 1 via TOPICAL

## 2020-04-06 MED ORDER — SONAFINE EX EMUL
1.0000 "application " | Freq: Two times a day (BID) | CUTANEOUS | Status: DC
  Administered 2020-04-06: 1 via TOPICAL

## 2020-04-06 NOTE — Progress Notes (Signed)

## 2020-04-07 ENCOUNTER — Ambulatory Visit
Admission: RE | Admit: 2020-04-07 | Discharge: 2020-04-07 | Disposition: A | Discharge status: Home / Self Care | Payer: Medicare Other | Source: Ambulatory Visit | Attending: Radiation Oncology | Admitting: Radiation Oncology

## 2020-04-07 ENCOUNTER — Other Ambulatory Visit: Payer: Self-pay

## 2020-04-07 DIAGNOSIS — Z510 Encounter for antineoplastic radiation therapy: Secondary | ICD-10-CM | POA: Diagnosis not present

## 2020-04-08 ENCOUNTER — Other Ambulatory Visit: Payer: Self-pay

## 2020-04-08 ENCOUNTER — Ambulatory Visit
Admission: RE | Admit: 2020-04-08 | Discharge: 2020-04-08 | Disposition: A | Discharge status: Home / Self Care | Payer: Medicare Other | Source: Ambulatory Visit | Attending: Radiation Oncology | Admitting: Radiation Oncology

## 2020-04-08 DIAGNOSIS — Z510 Encounter for antineoplastic radiation therapy: Secondary | ICD-10-CM | POA: Diagnosis not present

## 2020-04-09 ENCOUNTER — Ambulatory Visit
Admission: RE | Admit: 2020-04-09 | Discharge: 2020-04-09 | Disposition: A | Discharge status: Home / Self Care | Payer: Medicare Other | Source: Ambulatory Visit | Attending: Radiation Oncology | Admitting: Radiation Oncology

## 2020-04-09 ENCOUNTER — Other Ambulatory Visit: Payer: Self-pay

## 2020-04-09 DIAGNOSIS — Z170 Estrogen receptor positive status [ER+]: Secondary | ICD-10-CM | POA: Diagnosis not present

## 2020-04-09 DIAGNOSIS — Z510 Encounter for antineoplastic radiation therapy: Secondary | ICD-10-CM | POA: Insufficient documentation

## 2020-04-09 DIAGNOSIS — C50212 Malignant neoplasm of upper-inner quadrant of left female breast: Secondary | ICD-10-CM | POA: Insufficient documentation

## 2020-04-10 ENCOUNTER — Ambulatory Visit
Admission: RE | Admit: 2020-04-10 | Discharge: 2020-04-10 | Disposition: A | Discharge status: Home / Self Care | Payer: Medicare Other | Source: Ambulatory Visit | Attending: Radiation Oncology | Admitting: Radiation Oncology

## 2020-04-10 ENCOUNTER — Other Ambulatory Visit: Payer: Self-pay

## 2020-04-10 DIAGNOSIS — Z510 Encounter for antineoplastic radiation therapy: Secondary | ICD-10-CM | POA: Diagnosis not present

## 2020-04-14 ENCOUNTER — Other Ambulatory Visit: Payer: Self-pay

## 2020-04-14 ENCOUNTER — Ambulatory Visit
Admission: RE | Admit: 2020-04-14 | Discharge: 2020-04-14 | Disposition: A | Discharge status: Home / Self Care | Payer: Medicare Other | Source: Ambulatory Visit | Attending: Radiation Oncology | Admitting: Radiation Oncology

## 2020-04-14 DIAGNOSIS — Z510 Encounter for antineoplastic radiation therapy: Secondary | ICD-10-CM | POA: Diagnosis not present

## 2020-04-15 ENCOUNTER — Ambulatory Visit
Admission: RE | Admit: 2020-04-15 | Discharge: 2020-04-15 | Disposition: A | Discharge status: Home / Self Care | Payer: Medicare Other | Source: Ambulatory Visit | Attending: Radiation Oncology | Admitting: Radiation Oncology

## 2020-04-15 ENCOUNTER — Other Ambulatory Visit: Payer: Self-pay

## 2020-04-15 DIAGNOSIS — Z510 Encounter for antineoplastic radiation therapy: Secondary | ICD-10-CM | POA: Diagnosis not present

## 2020-04-16 ENCOUNTER — Inpatient Hospital Stay: Payer: Medicare Other | Attending: Oncology | Admitting: Medical

## 2020-04-16 ENCOUNTER — Encounter: Payer: Self-pay | Admitting: Medical

## 2020-04-16 ENCOUNTER — Ambulatory Visit
Admission: RE | Admit: 2020-04-16 | Discharge: 2020-04-16 | Disposition: A | Discharge status: Home / Self Care | Payer: Medicare Other | Source: Ambulatory Visit | Attending: Radiation Oncology | Admitting: Radiation Oncology

## 2020-04-16 ENCOUNTER — Other Ambulatory Visit: Payer: Self-pay

## 2020-04-16 VITALS — BP 154/64 | HR 65 | Temp 98.4°F | Resp 18 | Ht 65.0 in | Wt 158.5 lb

## 2020-04-16 DIAGNOSIS — Z170 Estrogen receptor positive status [ER+]: Secondary | ICD-10-CM | POA: Insufficient documentation

## 2020-04-16 DIAGNOSIS — C50212 Malignant neoplasm of upper-inner quadrant of left female breast: Secondary | ICD-10-CM | POA: Insufficient documentation

## 2020-04-16 DIAGNOSIS — Z79899 Other long term (current) drug therapy: Secondary | ICD-10-CM | POA: Insufficient documentation

## 2020-04-16 DIAGNOSIS — Z5112 Encounter for antineoplastic immunotherapy: Secondary | ICD-10-CM | POA: Insufficient documentation

## 2020-04-16 DIAGNOSIS — Z510 Encounter for antineoplastic radiation therapy: Secondary | ICD-10-CM | POA: Diagnosis not present

## 2020-04-16 DIAGNOSIS — B372 Candidiasis of skin and nail: Secondary | ICD-10-CM

## 2020-04-16 NOTE — Patient Instructions (Addendum)

## 2020-04-17 ENCOUNTER — Ambulatory Visit
Admission: RE | Admit: 2020-04-17 | Discharge: 2020-04-17 | Disposition: A | Discharge status: Home / Self Care | Payer: Medicare Other | Source: Ambulatory Visit | Attending: Radiation Oncology | Admitting: Radiation Oncology

## 2020-04-17 ENCOUNTER — Other Ambulatory Visit: Payer: Self-pay

## 2020-04-17 DIAGNOSIS — Z510 Encounter for antineoplastic radiation therapy: Secondary | ICD-10-CM | POA: Diagnosis not present

## 2020-04-17 NOTE — Progress Notes (Signed)
Michelle [name] was seen per the request of radiation oncology today.  She was seen for a rash under her right breast.  The patient reports that she has had a similar rash before which has come and gone and has been noted when she was wearing an underwire bra.  The area was examined and was noted to be a cherry red rash with multiple small satellite lesions consistent with a yeast infection.  The patient was given a prescription for Mycelex powder with instructions to use this several times daily until the area resolves.  She expressed understanding and agreement with this plan  Sandi Mealy, MHS, PA-C Physician Assistant

## 2020-04-20 ENCOUNTER — Ambulatory Visit
Admission: RE | Admit: 2020-04-20 | Discharge: 2020-04-20 | Disposition: A | Discharge status: Home / Self Care | Payer: Medicare Other | Source: Ambulatory Visit | Attending: Radiation Oncology | Admitting: Radiation Oncology

## 2020-04-20 ENCOUNTER — Other Ambulatory Visit: Payer: Self-pay

## 2020-04-20 DIAGNOSIS — Z510 Encounter for antineoplastic radiation therapy: Secondary | ICD-10-CM | POA: Diagnosis not present

## 2020-04-21 ENCOUNTER — Other Ambulatory Visit: Payer: Self-pay

## 2020-04-21 ENCOUNTER — Ambulatory Visit
Admission: RE | Admit: 2020-04-21 | Discharge: 2020-04-21 | Disposition: A | Discharge status: Home / Self Care | Payer: Medicare Other | Source: Ambulatory Visit | Attending: Radiation Oncology | Admitting: Radiation Oncology

## 2020-04-21 DIAGNOSIS — Z510 Encounter for antineoplastic radiation therapy: Secondary | ICD-10-CM | POA: Diagnosis not present

## 2020-04-22 ENCOUNTER — Other Ambulatory Visit: Payer: Self-pay

## 2020-04-22 ENCOUNTER — Ambulatory Visit
Admission: RE | Admit: 2020-04-22 | Discharge: 2020-04-22 | Disposition: A | Discharge status: Home / Self Care | Payer: Medicare Other | Source: Ambulatory Visit | Attending: Radiation Oncology | Admitting: Radiation Oncology

## 2020-04-22 DIAGNOSIS — Z510 Encounter for antineoplastic radiation therapy: Secondary | ICD-10-CM | POA: Diagnosis not present

## 2020-04-23 ENCOUNTER — Other Ambulatory Visit: Payer: Medicare Other

## 2020-04-23 ENCOUNTER — Inpatient Hospital Stay: Payer: Medicare Other

## 2020-04-23 ENCOUNTER — Other Ambulatory Visit: Payer: Self-pay

## 2020-04-23 ENCOUNTER — Ambulatory Visit
Admission: RE | Admit: 2020-04-23 | Discharge: 2020-04-23 | Disposition: A | Discharge status: Home / Self Care | Payer: Medicare Other | Source: Ambulatory Visit | Attending: Radiation Oncology | Admitting: Radiation Oncology

## 2020-04-23 VITALS — BP 164/71 | HR 71 | Temp 98.0°F | Resp 16

## 2020-04-23 DIAGNOSIS — Z5112 Encounter for antineoplastic immunotherapy: Secondary | ICD-10-CM | POA: Diagnosis not present

## 2020-04-23 DIAGNOSIS — Z510 Encounter for antineoplastic radiation therapy: Secondary | ICD-10-CM | POA: Diagnosis not present

## 2020-04-23 DIAGNOSIS — C50212 Malignant neoplasm of upper-inner quadrant of left female breast: Secondary | ICD-10-CM | POA: Diagnosis present

## 2020-04-23 DIAGNOSIS — Z79899 Other long term (current) drug therapy: Secondary | ICD-10-CM | POA: Diagnosis not present

## 2020-04-23 DIAGNOSIS — Z170 Estrogen receptor positive status [ER+]: Secondary | ICD-10-CM

## 2020-04-23 LAB — COMPREHENSIVE METABOLIC PANEL
ALT: 19 U/L (ref 0–44)
AST: 20 U/L (ref 15–41)
Albumin: 3.5 g/dL (ref 3.5–5.0)
Alkaline Phosphatase: 66 U/L (ref 38–126)
Anion gap: 7 (ref 5–15)
BUN: 13 mg/dL (ref 8–23)
CO2: 28 mmol/L (ref 22–32)
Calcium: 9.1 mg/dL (ref 8.9–10.3)
Chloride: 103 mmol/L (ref 98–111)
Creatinine, Ser: 0.82 mg/dL (ref 0.44–1.00)
GFR calc Af Amer: >60 mL/min (ref >60)
GFR calc non Af Amer: >60 mL/min (ref >60)
Glucose, Bld: 92 mg/dL (ref 70–99)
Potassium: 4.3 mmol/L (ref 3.5–5.1)
Sodium: 138 mmol/L (ref 135–145)
Total Bilirubin: 0.5 mg/dL (ref 0.3–1.2)
Total Protein: 6.5 g/dL (ref 6.5–8.1)

## 2020-04-23 LAB — CBC WITH DIFFERENTIAL/PLATELET
Abs Immature Granulocytes: 0.01 10*3/uL (ref 0.00–0.07)
Basophils Absolute: 0 10*3/uL (ref 0.0–0.1)
Basophils Relative: 0 %
Eosinophils Absolute: 0.1 10*3/uL (ref 0.0–0.5)
Eosinophils Relative: 3 %
HCT: 39.1 % (ref 36.0–46.0)
Hemoglobin: 13 g/dL (ref 12.0–15.0)
Immature Granulocytes: 0 %
Lymphocytes Relative: 16 %
Lymphs Abs: 0.8 10*3/uL (ref 0.7–4.0)
MCH: 30.8 pg (ref 26.0–34.0)
MCHC: 33.2 g/dL (ref 30.0–36.0)
MCV: 92.7 fL (ref 80.0–100.0)
Monocytes Absolute: 0.5 10*3/uL (ref 0.1–1.0)
Monocytes Relative: 10 %
Neutro Abs: 3.6 10*3/uL (ref 1.7–7.7)
Neutrophils Relative %: 71 %
Platelets: 141 10*3/uL — ABNORMAL LOW (ref 150–400)
RBC: 4.22 MIL/uL (ref 3.87–5.11)
RDW: 12.1 % (ref 11.5–15.5)
WBC: 5.2 10*3/uL (ref 4.0–10.5)
nRBC: 0 % (ref 0.0–0.2)

## 2020-04-23 MED ORDER — ACETAMINOPHEN 325 MG PO TABS
650.0000 mg | ORAL_TABLET | Freq: Once | ORAL | Status: AC
  Administered 2020-04-23: 650 mg via ORAL

## 2020-04-23 MED ORDER — ACETAMINOPHEN 325 MG PO TABS
ORAL_TABLET | ORAL | Status: AC
  Filled 2020-04-23: qty 2

## 2020-04-23 MED ORDER — SODIUM CHLORIDE 0.9% FLUSH
10.0000 mL | INTRAVENOUS | Status: DC | PRN
  Administered 2020-04-23: 10 mL
  Filled 2020-04-23: qty 10

## 2020-04-23 MED ORDER — DIPHENHYDRAMINE HCL 12.5 MG/5ML PO ELIX
ORAL_SOLUTION | ORAL | Status: AC
  Filled 2020-04-23: qty 5

## 2020-04-23 MED ORDER — SODIUM CHLORIDE 0.9 % IV SOLN
Freq: Once | INTRAVENOUS | Status: AC
  Filled 2020-04-23: qty 250

## 2020-04-23 MED ORDER — DIPHENHYDRAMINE HCL 12.5 MG/5ML PO ELIX
12.5000 mg | ORAL_SOLUTION | Freq: Once | ORAL | Status: AC
  Administered 2020-04-23: 12.5 mg via ORAL

## 2020-04-23 MED ORDER — HEPARIN SOD (PORK) LOCK FLUSH 100 UNIT/ML IV SOLN
500.0000 [IU] | Freq: Once | INTRAVENOUS | Status: AC | PRN
  Administered 2020-04-23: 500 [IU]
  Filled 2020-04-23: qty 5

## 2020-04-23 MED ORDER — TRASTUZUMAB-ANNS CHEMO 150 MG IV SOLR
450.0000 mg | Freq: Once | INTRAVENOUS | Status: AC
  Administered 2020-04-23: 450 mg via INTRAVENOUS
  Filled 2020-04-23: qty 21.43

## 2020-04-24 ENCOUNTER — Ambulatory Visit
Admission: RE | Admit: 2020-04-24 | Discharge: 2020-04-24 | Disposition: A | Discharge status: Home / Self Care | Payer: Medicare Other | Source: Ambulatory Visit | Attending: Radiation Oncology | Admitting: Radiation Oncology

## 2020-04-24 ENCOUNTER — Other Ambulatory Visit: Payer: Self-pay

## 2020-04-24 DIAGNOSIS — Z510 Encounter for antineoplastic radiation therapy: Secondary | ICD-10-CM | POA: Diagnosis not present

## 2020-04-27 ENCOUNTER — Other Ambulatory Visit: Payer: Self-pay

## 2020-04-27 ENCOUNTER — Ambulatory Visit
Admission: RE | Admit: 2020-04-27 | Discharge: 2020-04-27 | Disposition: A | Discharge status: Home / Self Care | Payer: Medicare Other | Source: Ambulatory Visit | Attending: Radiation Oncology | Admitting: Radiation Oncology

## 2020-04-27 DIAGNOSIS — Z510 Encounter for antineoplastic radiation therapy: Secondary | ICD-10-CM | POA: Diagnosis not present

## 2020-04-28 ENCOUNTER — Other Ambulatory Visit: Payer: Self-pay

## 2020-04-28 ENCOUNTER — Ambulatory Visit
Admission: RE | Admit: 2020-04-28 | Discharge: 2020-04-28 | Disposition: A | Discharge status: Home / Self Care | Payer: Medicare Other | Source: Ambulatory Visit | Attending: Radiation Oncology | Admitting: Radiation Oncology

## 2020-04-28 DIAGNOSIS — Z510 Encounter for antineoplastic radiation therapy: Secondary | ICD-10-CM | POA: Diagnosis not present

## 2020-04-29 ENCOUNTER — Ambulatory Visit
Admission: RE | Admit: 2020-04-29 | Discharge: 2020-04-29 | Disposition: A | Discharge status: Home / Self Care | Payer: Medicare Other | Source: Ambulatory Visit | Attending: Radiation Oncology | Admitting: Radiation Oncology

## 2020-04-29 DIAGNOSIS — Z510 Encounter for antineoplastic radiation therapy: Secondary | ICD-10-CM | POA: Diagnosis not present

## 2020-04-30 ENCOUNTER — Ambulatory Visit
Admission: RE | Admit: 2020-04-30 | Discharge: 2020-04-30 | Disposition: A | Discharge status: Home / Self Care | Payer: Medicare Other | Source: Ambulatory Visit | Attending: Radiation Oncology | Admitting: Radiation Oncology

## 2020-04-30 DIAGNOSIS — Z510 Encounter for antineoplastic radiation therapy: Secondary | ICD-10-CM | POA: Diagnosis not present

## 2020-05-01 ENCOUNTER — Encounter: Payer: Self-pay | Admitting: *Deleted

## 2020-05-01 ENCOUNTER — Ambulatory Visit
Admission: RE | Admit: 2020-05-01 | Discharge: 2020-05-01 | Disposition: A | Discharge status: Home / Self Care | Payer: Medicare Other | Source: Ambulatory Visit | Attending: Radiation Oncology | Admitting: Radiation Oncology

## 2020-05-01 DIAGNOSIS — Z510 Encounter for antineoplastic radiation therapy: Secondary | ICD-10-CM | POA: Diagnosis not present

## 2020-05-04 ENCOUNTER — Encounter: Payer: Self-pay | Admitting: Radiation Oncology

## 2020-05-04 ENCOUNTER — Other Ambulatory Visit: Payer: Self-pay

## 2020-05-04 ENCOUNTER — Ambulatory Visit
Admission: RE | Admit: 2020-05-04 | Discharge: 2020-05-04 | Disposition: A | Discharge status: Home / Self Care | Payer: Medicare Other | Source: Ambulatory Visit | Attending: Radiation Oncology | Admitting: Radiation Oncology

## 2020-05-04 DIAGNOSIS — Z510 Encounter for antineoplastic radiation therapy: Secondary | ICD-10-CM | POA: Diagnosis not present

## 2020-05-05 ENCOUNTER — Other Ambulatory Visit: Payer: Self-pay | Admitting: *Deleted

## 2020-05-05 NOTE — Telephone Encounter (Signed)
This RN spoke with pt per her inquiry about continuing the anastrozole.  This RN informed her she is take the medication every day with goal for her to take it for 5 years.  Per discussion- pt asked about " what does this medication do "- this RN explained to her how the pill works with goal of her cancer never returning.  Of note pt states " it's just a little more expensive then my other medication " but she did not recall what she paid- she will let this RN know when it is time to refill.  No other questions at this time.  This RN will forward inquiry about cost of anastrozole to our managed care department for possible resources for assistance with cost of medication.

## 2020-05-12 ENCOUNTER — Ambulatory Visit (HOSPITAL_COMMUNITY)
Admission: RE | Admit: 2020-05-12 | Discharge: 2020-05-12 | Disposition: A | Discharge status: Home / Self Care | Payer: Medicare Other | Source: Ambulatory Visit | Attending: Oncology | Admitting: Oncology

## 2020-05-12 ENCOUNTER — Other Ambulatory Visit (HOSPITAL_COMMUNITY): Payer: Medicare Other

## 2020-05-12 ENCOUNTER — Other Ambulatory Visit: Payer: Self-pay

## 2020-05-12 DIAGNOSIS — Z170 Estrogen receptor positive status [ER+]: Secondary | ICD-10-CM | POA: Insufficient documentation

## 2020-05-12 DIAGNOSIS — C50212 Malignant neoplasm of upper-inner quadrant of left female breast: Secondary | ICD-10-CM | POA: Insufficient documentation

## 2020-05-12 DIAGNOSIS — I119 Hypertensive heart disease without heart failure: Secondary | ICD-10-CM | POA: Diagnosis not present

## 2020-05-12 DIAGNOSIS — E785 Hyperlipidemia, unspecified: Secondary | ICD-10-CM | POA: Insufficient documentation

## 2020-05-12 DIAGNOSIS — Z87891 Personal history of nicotine dependence: Secondary | ICD-10-CM | POA: Diagnosis not present

## 2020-05-12 LAB — ECHOCARDIOGRAM COMPLETE
Area-P 1/2: 2.59 cm2
S' Lateral: 2.6 cm

## 2020-05-12 NOTE — Progress Notes (Signed)
°  Echocardiogram 2D Echocardiogram has been performed.  Jannett Celestine 05/12/2020, 1:34 PM
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
## 2020-05-14 ENCOUNTER — Other Ambulatory Visit: Payer: Self-pay

## 2020-05-14 ENCOUNTER — Inpatient Hospital Stay: Payer: Medicare Other | Attending: Oncology

## 2020-05-14 ENCOUNTER — Encounter: Payer: Self-pay | Admitting: *Deleted

## 2020-05-14 ENCOUNTER — Inpatient Hospital Stay: Payer: Medicare Other

## 2020-05-14 ENCOUNTER — Inpatient Hospital Stay (HOSPITAL_BASED_OUTPATIENT_CLINIC_OR_DEPARTMENT_OTHER): Payer: Medicare Other | Admitting: Oncology

## 2020-05-14 VITALS — BP 157/54 | HR 76 | Temp 98.3°F | Resp 18 | Ht 65.0 in | Wt 159.2 lb

## 2020-05-14 DIAGNOSIS — Z5112 Encounter for antineoplastic immunotherapy: Secondary | ICD-10-CM | POA: Diagnosis not present

## 2020-05-14 DIAGNOSIS — C50212 Malignant neoplasm of upper-inner quadrant of left female breast: Secondary | ICD-10-CM

## 2020-05-14 DIAGNOSIS — Z87891 Personal history of nicotine dependence: Secondary | ICD-10-CM | POA: Diagnosis not present

## 2020-05-14 DIAGNOSIS — Z811 Family history of alcohol abuse and dependence: Secondary | ICD-10-CM | POA: Diagnosis not present

## 2020-05-14 DIAGNOSIS — Z170 Estrogen receptor positive status [ER+]: Secondary | ICD-10-CM | POA: Diagnosis not present

## 2020-05-14 DIAGNOSIS — Z79899 Other long term (current) drug therapy: Secondary | ICD-10-CM | POA: Diagnosis not present

## 2020-05-14 DIAGNOSIS — Z823 Family history of stroke: Secondary | ICD-10-CM | POA: Insufficient documentation

## 2020-05-14 DIAGNOSIS — Z95828 Presence of other vascular implants and grafts: Secondary | ICD-10-CM | POA: Insufficient documentation

## 2020-05-14 LAB — COMPREHENSIVE METABOLIC PANEL
ALT: 18 U/L (ref 0–44)
AST: 23 U/L (ref 15–41)
Albumin: 3.6 g/dL (ref 3.5–5.0)
Alkaline Phosphatase: 68 U/L (ref 38–126)
Anion gap: 9 (ref 5–15)
BUN: 13 mg/dL (ref 8–23)
CO2: 27 mmol/L (ref 22–32)
Calcium: 9.5 mg/dL (ref 8.9–10.3)
Chloride: 102 mmol/L (ref 98–111)
Creatinine, Ser: 0.8 mg/dL (ref 0.44–1.00)
GFR calc Af Amer: >60 mL/min (ref >60)
GFR calc non Af Amer: >60 mL/min (ref >60)
Glucose, Bld: 92 mg/dL (ref 70–99)
Potassium: 4.2 mmol/L (ref 3.5–5.1)
Sodium: 138 mmol/L (ref 135–145)
Total Bilirubin: 0.6 mg/dL (ref 0.3–1.2)
Total Protein: 6.6 g/dL (ref 6.5–8.1)

## 2020-05-14 LAB — CBC WITH DIFFERENTIAL/PLATELET
Abs Immature Granulocytes: 0.01 10*3/uL (ref 0.00–0.07)
Basophils Absolute: 0 10*3/uL (ref 0.0–0.1)
Basophils Relative: 0 %
Eosinophils Absolute: 0.2 10*3/uL (ref 0.0–0.5)
Eosinophils Relative: 4 %
HCT: 38.3 % (ref 36.0–46.0)
Hemoglobin: 12.7 g/dL (ref 12.0–15.0)
Immature Granulocytes: 0 %
Lymphocytes Relative: 17 %
Lymphs Abs: 0.8 10*3/uL (ref 0.7–4.0)
MCH: 30.2 pg (ref 26.0–34.0)
MCHC: 33.2 g/dL (ref 30.0–36.0)
MCV: 91 fL (ref 80.0–100.0)
Monocytes Absolute: 0.5 10*3/uL (ref 0.1–1.0)
Monocytes Relative: 12 %
Neutro Abs: 3 10*3/uL (ref 1.7–7.7)
Neutrophils Relative %: 67 %
Platelets: 137 10*3/uL — ABNORMAL LOW (ref 150–400)
RBC: 4.21 MIL/uL (ref 3.87–5.11)
RDW: 12.3 % (ref 11.5–15.5)
WBC: 4.5 10*3/uL (ref 4.0–10.5)
nRBC: 0 % (ref 0.0–0.2)

## 2020-05-14 MED ORDER — SODIUM CHLORIDE 0.9% FLUSH
10.0000 mL | Freq: Once | INTRAVENOUS | Status: AC
  Administered 2020-05-14: 10 mL
  Filled 2020-05-14: qty 10

## 2020-05-14 MED ORDER — HEPARIN SOD (PORK) LOCK FLUSH 100 UNIT/ML IV SOLN
500.0000 [IU] | Freq: Once | INTRAVENOUS | Status: AC | PRN
  Administered 2020-05-14: 500 [IU]
  Filled 2020-05-14: qty 5

## 2020-05-14 MED ORDER — DIPHENHYDRAMINE HCL 12.5 MG/5ML PO ELIX
ORAL_SOLUTION | ORAL | Status: AC
  Filled 2020-05-14: qty 5

## 2020-05-14 MED ORDER — SODIUM CHLORIDE 0.9% FLUSH
10.0000 mL | INTRAVENOUS | Status: DC | PRN
  Administered 2020-05-14: 10 mL
  Filled 2020-05-14: qty 10

## 2020-05-14 MED ORDER — ACETAMINOPHEN 325 MG PO TABS
650.0000 mg | ORAL_TABLET | Freq: Once | ORAL | Status: AC
  Administered 2020-05-14: 650 mg via ORAL

## 2020-05-14 MED ORDER — ACETAMINOPHEN 325 MG PO TABS
ORAL_TABLET | ORAL | Status: AC
  Filled 2020-05-14: qty 2

## 2020-05-14 MED ORDER — SODIUM CHLORIDE 0.9 % IV SOLN
Freq: Once | INTRAVENOUS | Status: AC
  Filled 2020-05-14: qty 250

## 2020-05-14 MED ORDER — DIPHENHYDRAMINE HCL 12.5 MG/5ML PO ELIX
12.5000 mg | ORAL_SOLUTION | Freq: Once | ORAL | Status: AC
  Administered 2020-05-14: 12.5 mg via ORAL

## 2020-05-14 MED ORDER — TRASTUZUMAB-ANNS CHEMO 150 MG IV SOLR
450.0000 mg | Freq: Once | INTRAVENOUS | Status: AC
  Administered 2020-05-14: 450 mg via INTRAVENOUS
  Filled 2020-05-14: qty 21.43

## 2020-05-14 MED ORDER — KETOCONAZOLE 2 % EX CREA: 1.0000 "application " | TOPICAL_CREAM | Freq: Every day | CUTANEOUS | 0 refills | Status: DC

## 2020-05-14 NOTE — Patient Instructions (Signed)
[facility] Discharge Instructions for Patients Receiving Chemotherapy  Today you received the following chemotherapy agents trastuzumab.  To help prevent nausea and vomiting after your treatment, we encourage you to take your nausea medication as directed.    If you develop nausea and vomiting that is not controlled by your nausea medication, call the clinic.   BELOW ARE SYMPTOMS THAT SHOULD BE REPORTED IMMEDIATELY:  *FEVER GREATER THAN 100.5 F  *CHILLS WITH OR WITHOUT FEVER  NAUSEA AND VOMITING THAT IS NOT CONTROLLED WITH YOUR NAUSEA MEDICATION  *UNUSUAL SHORTNESS OF BREATH  *UNUSUAL BRUISING OR BLEEDING  TENDERNESS IN MOUTH AND THROAT WITH OR WITHOUT PRESENCE OF ULCERS  *URINARY PROBLEMS  *BOWEL PROBLEMS  UNUSUAL RASH Items with * indicate a potential emergency and should be followed up as soon as possible.  Feel free to call the clinic should you have any questions or concerns. The clinic phone number is [phone].  Please show the CHEMO ALERT CARD at check-in to the Emergency Department and triage nurse.   

## 2020-05-16 ENCOUNTER — Other Ambulatory Visit: Payer: Self-pay | Admitting: Oncology

## 2020-05-20 ENCOUNTER — Encounter: Payer: Self-pay | Admitting: *Deleted

## 2020-06-04 ENCOUNTER — Inpatient Hospital Stay: Payer: Medicare Other

## 2020-06-04 ENCOUNTER — Other Ambulatory Visit: Payer: Self-pay

## 2020-06-04 VITALS — BP 146/65 | HR 69 | Temp 98.7°F | Resp 16

## 2020-06-04 DIAGNOSIS — Z170 Estrogen receptor positive status [ER+]: Secondary | ICD-10-CM

## 2020-06-04 DIAGNOSIS — Z95828 Presence of other vascular implants and grafts: Secondary | ICD-10-CM

## 2020-06-04 DIAGNOSIS — Z5112 Encounter for antineoplastic immunotherapy: Secondary | ICD-10-CM | POA: Diagnosis not present

## 2020-06-04 LAB — COMPREHENSIVE METABOLIC PANEL
ALT: 14 U/L (ref 0–44)
AST: 19 U/L (ref 15–41)
Albumin: 3.4 g/dL — ABNORMAL LOW (ref 3.5–5.0)
Alkaline Phosphatase: 71 U/L (ref 38–126)
Anion gap: 4 — ABNORMAL LOW (ref 5–15)
BUN: 16 mg/dL (ref 8–23)
CO2: 29 mmol/L (ref 22–32)
Calcium: 9.2 mg/dL (ref 8.9–10.3)
Chloride: 103 mmol/L (ref 98–111)
Creatinine, Ser: 0.77 mg/dL (ref 0.44–1.00)
GFR calc Af Amer: >60 mL/min (ref >60)
GFR calc non Af Amer: >60 mL/min (ref >60)
Glucose, Bld: 94 mg/dL (ref 70–99)
Potassium: 4.4 mmol/L (ref 3.5–5.1)
Sodium: 136 mmol/L (ref 135–145)
Total Bilirubin: 0.5 mg/dL (ref 0.3–1.2)
Total Protein: 6.3 g/dL — ABNORMAL LOW (ref 6.5–8.1)

## 2020-06-04 LAB — CBC WITH DIFFERENTIAL/PLATELET
Abs Immature Granulocytes: 0.02 10*3/uL (ref 0.00–0.07)
Basophils Absolute: 0 10*3/uL (ref 0.0–0.1)
Basophils Relative: 0 %
Eosinophils Absolute: 0.1 10*3/uL (ref 0.0–0.5)
Eosinophils Relative: 3 %
HCT: 37.1 % (ref 36.0–46.0)
Hemoglobin: 12.2 g/dL (ref 12.0–15.0)
Immature Granulocytes: 0 %
Lymphocytes Relative: 15 %
Lymphs Abs: 0.8 10*3/uL (ref 0.7–4.0)
MCH: 30.2 pg (ref 26.0–34.0)
MCHC: 32.9 g/dL (ref 30.0–36.0)
MCV: 91.8 fL (ref 80.0–100.0)
Monocytes Absolute: 0.7 10*3/uL (ref 0.1–1.0)
Monocytes Relative: 14 %
Neutro Abs: 3.4 10*3/uL (ref 1.7–7.7)
Neutrophils Relative %: 68 %
Platelets: 140 10*3/uL — ABNORMAL LOW (ref 150–400)
RBC: 4.04 MIL/uL (ref 3.87–5.11)
RDW: 12.4 % (ref 11.5–15.5)
WBC: 5 10*3/uL (ref 4.0–10.5)
nRBC: 0 % (ref 0.0–0.2)

## 2020-06-04 MED ORDER — DIPHENHYDRAMINE HCL 12.5 MG/5ML PO ELIX
ORAL_SOLUTION | ORAL | Status: AC
  Filled 2020-06-04: qty 5

## 2020-06-04 MED ORDER — HEPARIN SOD (PORK) LOCK FLUSH 100 UNIT/ML IV SOLN
500.0000 [IU] | Freq: Once | INTRAVENOUS | Status: DC
  Filled 2020-06-04: qty 5

## 2020-06-04 MED ORDER — DIPHENHYDRAMINE HCL 12.5 MG/5ML PO ELIX
12.5000 mg | ORAL_SOLUTION | Freq: Once | ORAL | Status: AC
  Administered 2020-06-04: 12.5 mg via ORAL

## 2020-06-04 MED ORDER — TRASTUZUMAB-ANNS CHEMO 150 MG IV SOLR
6.0000 mg/kg | Freq: Once | INTRAVENOUS | Status: AC
  Administered 2020-06-04: 441 mg via INTRAVENOUS
  Filled 2020-06-04: qty 21

## 2020-06-04 MED ORDER — SODIUM CHLORIDE 0.9% FLUSH
10.0000 mL | Freq: Once | INTRAVENOUS | Status: AC
  Administered 2020-06-04: 10 mL
  Filled 2020-06-04: qty 10

## 2020-06-04 MED ORDER — SODIUM CHLORIDE 0.9 % IV SOLN
Freq: Once | INTRAVENOUS | Status: AC
  Filled 2020-06-04: qty 250

## 2020-06-04 MED ORDER — HEPARIN SOD (PORK) LOCK FLUSH 100 UNIT/ML IV SOLN
500.0000 [IU] | Freq: Once | INTRAVENOUS | Status: AC | PRN
  Administered 2020-06-04: 500 [IU]
  Filled 2020-06-04: qty 5

## 2020-06-04 MED ORDER — ACETAMINOPHEN 325 MG PO TABS
ORAL_TABLET | ORAL | Status: AC
  Filled 2020-06-04: qty 2

## 2020-06-04 MED ORDER — ACETAMINOPHEN 325 MG PO TABS
650.0000 mg | ORAL_TABLET | Freq: Once | ORAL | Status: AC
  Administered 2020-06-04: 650 mg via ORAL

## 2020-06-04 MED ORDER — SODIUM CHLORIDE 0.9% FLUSH
10.0000 mL | INTRAVENOUS | Status: DC | PRN
  Administered 2020-06-04: 10 mL
  Filled 2020-06-04: qty 10

## 2020-06-04 NOTE — Patient Instructions (Signed)
[facility] Discharge Instructions for Patients Receiving Chemotherapy  Today you received the following chemotherapy agents trastuzumab.  To help prevent nausea and vomiting after your treatment, we encourage you to take your nausea medication as directed.    If you develop nausea and vomiting that is not controlled by your nausea medication, call the clinic.   BELOW ARE SYMPTOMS THAT SHOULD BE REPORTED IMMEDIATELY:  *FEVER GREATER THAN 100.5 F  *CHILLS WITH OR WITHOUT FEVER  NAUSEA AND VOMITING THAT IS NOT CONTROLLED WITH YOUR NAUSEA MEDICATION  *UNUSUAL SHORTNESS OF BREATH  *UNUSUAL BRUISING OR BLEEDING  TENDERNESS IN MOUTH AND THROAT WITH OR WITHOUT PRESENCE OF ULCERS  *URINARY PROBLEMS  *BOWEL PROBLEMS  UNUSUAL RASH Items with * indicate a potential emergency and should be followed up as soon as possible.  Feel free to call the clinic should you have any questions or concerns. The clinic phone number is [phone].  Please show the CHEMO ALERT CARD at check-in to the Emergency Department and triage nurse.   

## 2020-06-04 NOTE — Patient Instructions (Signed)

## 2020-06-09 ENCOUNTER — Telehealth: Payer: Self-pay

## 2020-06-09 NOTE — Telephone Encounter (Signed)
I called the patient today about her upcoming follow-up appointment in radiation oncology.   Given the state of the COVID-19 pandemic, concerning case numbers in our community, and guidance from [facility], I offered a phone assessment with the patient to determine if coming to the clinic was necessary. She accepted.  I let the patient know that I had spoken with Dr. Isidore Moos, and she wanted them to know the importance of washing their hands for at least 20 seconds at a time, especially after going out in public, and before they eat.  Limit going out in public whenever possible. Do not touch your face, unless your hands are clean, such as when bathing. Get plenty of rest, eat well, and stay hydrated. Patient verbalized understanding and agreement.   The patient denies any symptomatic concerns.  She still feels fatigued, but attributes that to her recent trastuzumab infusion rather then lingering effects from radiation therapy. She denied any chest pain, shortness of breath, or issues with range of motion to the left arm. She does still report a rash under her breast, but states she is applying the cream that Dr. Jana Hakim prescribed her at her last visit (ketoconazole), and will call if she still does not see improvement in a few weeks.  Otherwise skin is intact and healed in radiation fields.   Continue follow-up with medical oncology - follow-up is scheduled for some time in early December per Dr. Virgie Dad last office note.  I explained that yearly mammograms are important for patients with intact breast tissue, and physical exams are important after mastectomy for patients that cannot undergo mammography.  I encouraged her to call if she had further questions or concerns about her healing. Otherwise, she will follow-up PRN in radiation oncology. Patient is pleased with this plan, and we will cancel her upcoming follow-up to reduce the risk of COVID-19 transmission.

## 2020-06-10 ENCOUNTER — Ambulatory Visit: Payer: Medicare Other | Admitting: Radiation Oncology

## 2020-06-25 ENCOUNTER — Inpatient Hospital Stay: Payer: Medicare Other

## 2020-06-25 ENCOUNTER — Inpatient Hospital Stay: Payer: Medicare Other | Attending: Oncology

## 2020-06-25 ENCOUNTER — Other Ambulatory Visit: Payer: Self-pay

## 2020-06-25 VITALS — BP 174/70 | HR 68 | Temp 97.9°F | Resp 16 | Wt 160.8 lb

## 2020-06-25 DIAGNOSIS — C50212 Malignant neoplasm of upper-inner quadrant of left female breast: Secondary | ICD-10-CM | POA: Insufficient documentation

## 2020-06-25 DIAGNOSIS — R5383 Other fatigue: Secondary | ICD-10-CM | POA: Diagnosis not present

## 2020-06-25 DIAGNOSIS — Z79899 Other long term (current) drug therapy: Secondary | ICD-10-CM | POA: Diagnosis not present

## 2020-06-25 DIAGNOSIS — Z5112 Encounter for antineoplastic immunotherapy: Secondary | ICD-10-CM | POA: Insufficient documentation

## 2020-06-25 DIAGNOSIS — Z170 Estrogen receptor positive status [ER+]: Secondary | ICD-10-CM | POA: Diagnosis not present

## 2020-06-25 DIAGNOSIS — Z95828 Presence of other vascular implants and grafts: Secondary | ICD-10-CM

## 2020-06-25 LAB — CBC WITH DIFFERENTIAL/PLATELET
Abs Immature Granulocytes: 0.01 10*3/uL (ref 0.00–0.07)
Basophils Absolute: 0 10*3/uL (ref 0.0–0.1)
Basophils Relative: 0 %
Eosinophils Absolute: 0.2 10*3/uL (ref 0.0–0.5)
Eosinophils Relative: 4 %
HCT: 36.2 % (ref 36.0–46.0)
Hemoglobin: 11.8 g/dL — ABNORMAL LOW (ref 12.0–15.0)
Immature Granulocytes: 0 %
Lymphocytes Relative: 19 %
Lymphs Abs: 0.8 10*3/uL (ref 0.7–4.0)
MCH: 29.6 pg (ref 26.0–34.0)
MCHC: 32.6 g/dL (ref 30.0–36.0)
MCV: 90.7 fL (ref 80.0–100.0)
Monocytes Absolute: 0.5 10*3/uL (ref 0.1–1.0)
Monocytes Relative: 12 %
Neutro Abs: 2.8 10*3/uL (ref 1.7–7.7)
Neutrophils Relative %: 65 %
Platelets: 132 10*3/uL — ABNORMAL LOW (ref 150–400)
RBC: 3.99 MIL/uL (ref 3.87–5.11)
RDW: 12.2 % (ref 11.5–15.5)
WBC: 4.4 10*3/uL (ref 4.0–10.5)
nRBC: 0 % (ref 0.0–0.2)

## 2020-06-25 LAB — COMPREHENSIVE METABOLIC PANEL
ALT: 18 U/L (ref 0–44)
AST: 24 U/L (ref 15–41)
Albumin: 3.2 g/dL — ABNORMAL LOW (ref 3.5–5.0)
Alkaline Phosphatase: 78 U/L (ref 38–126)
Anion gap: 7 (ref 5–15)
BUN: 11 mg/dL (ref 8–23)
CO2: 28 mmol/L (ref 22–32)
Calcium: 9.1 mg/dL (ref 8.9–10.3)
Chloride: 104 mmol/L (ref 98–111)
Creatinine, Ser: 0.76 mg/dL (ref 0.44–1.00)
GFR calc Af Amer: >60 mL/min (ref >60)
GFR calc non Af Amer: >60 mL/min (ref >60)
Glucose, Bld: 100 mg/dL — ABNORMAL HIGH (ref 70–99)
Potassium: 4.4 mmol/L (ref 3.5–5.1)
Sodium: 139 mmol/L (ref 135–145)
Total Bilirubin: 0.4 mg/dL (ref 0.3–1.2)
Total Protein: 6.3 g/dL — ABNORMAL LOW (ref 6.5–8.1)

## 2020-06-25 MED ORDER — DIPHENHYDRAMINE HCL 12.5 MG/5ML PO ELIX
12.5000 mg | ORAL_SOLUTION | Freq: Once | ORAL | Status: AC
  Administered 2020-06-25: 12.5 mg via ORAL

## 2020-06-25 MED ORDER — SODIUM CHLORIDE 0.9% FLUSH
10.0000 mL | INTRAVENOUS | Status: DC | PRN
  Administered 2020-06-25: 10 mL
  Filled 2020-06-25: qty 10

## 2020-06-25 MED ORDER — DIPHENHYDRAMINE HCL 12.5 MG/5ML PO ELIX
ORAL_SOLUTION | ORAL | Status: AC
  Filled 2020-06-25: qty 5

## 2020-06-25 MED ORDER — HEPARIN SOD (PORK) LOCK FLUSH 100 UNIT/ML IV SOLN
500.0000 [IU] | Freq: Once | INTRAVENOUS | Status: AC | PRN
  Administered 2020-06-25: 500 [IU]
  Filled 2020-06-25: qty 5

## 2020-06-25 MED ORDER — ACETAMINOPHEN 325 MG PO TABS
ORAL_TABLET | ORAL | Status: AC
  Filled 2020-06-25: qty 2

## 2020-06-25 MED ORDER — SODIUM CHLORIDE 0.9% FLUSH
10.0000 mL | Freq: Once | INTRAVENOUS | Status: AC
  Administered 2020-06-25: 10 mL
  Filled 2020-06-25: qty 10

## 2020-06-25 MED ORDER — TRASTUZUMAB-ANNS CHEMO 150 MG IV SOLR
450.0000 mg | Freq: Once | INTRAVENOUS | Status: AC
  Administered 2020-06-25: 450 mg via INTRAVENOUS
  Filled 2020-06-25: qty 21.43

## 2020-06-25 MED ORDER — SODIUM CHLORIDE 0.9 % IV SOLN
Freq: Once | INTRAVENOUS | Status: AC
  Filled 2020-06-25: qty 250

## 2020-06-25 MED ORDER — ACETAMINOPHEN 325 MG PO TABS
650.0000 mg | ORAL_TABLET | Freq: Once | ORAL | Status: AC
  Administered 2020-06-25: 650 mg via ORAL

## 2020-06-25 NOTE — Patient Instructions (Signed)
[facility] Discharge Instructions for Patients Receiving Chemotherapy  Today you received the following chemotherapy agents trastuzumab.  To help prevent nausea and vomiting after your treatment, we encourage you to take your nausea medication as directed.    If you develop nausea and vomiting that is not controlled by your nausea medication, call the clinic.   BELOW ARE SYMPTOMS THAT SHOULD BE REPORTED IMMEDIATELY:  *FEVER GREATER THAN 100.5 F  *CHILLS WITH OR WITHOUT FEVER  NAUSEA AND VOMITING THAT IS NOT CONTROLLED WITH YOUR NAUSEA MEDICATION  *UNUSUAL SHORTNESS OF BREATH  *UNUSUAL BRUISING OR BLEEDING  TENDERNESS IN MOUTH AND THROAT WITH OR WITHOUT PRESENCE OF ULCERS  *URINARY PROBLEMS  *BOWEL PROBLEMS  UNUSUAL RASH Items with * indicate a potential emergency and should be followed up as soon as possible.  Feel free to call the clinic should you have any questions or concerns. The clinic phone number is [phone].  Please show the CHEMO ALERT CARD at check-in to the Emergency Department and triage nurse.   

## 2020-06-30 ENCOUNTER — Telehealth: Payer: Self-pay

## 2020-06-30 NOTE — Telephone Encounter (Signed)
Called patient to see how the rash under her left breast is fairing. She stated the that rash has cleared up, but she continues to put the powder she was given as well as a paper towel under her breast to keep area dry. She said she's more tired from her most recent trastuzumab infusion, but otherwise doing well. She denied any other needs at this time, but knows to call office back should something arise in the future.

## 2020-07-01 NOTE — Progress Notes (Signed)
  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-Malignant neoplasm of upper-inner quadrant of left female breast  Cancer Staging: Cancer Staging Malignant neoplasm of upper-inner quadrant of left breast in female, estrogen receptor positive ([place]) Staging form: Breast, AJCC 8th Edition - Clinical stage from 01/29/2020: Stage IA (cT1c, cN0, cM0, G3, ER+, PR+, HER2+) - Signed by Eppie Gibson, MD on 01/29/2020   Intent: Curative  Radiation Treatment Dates: 04/06/2020 through 05/04/2020 Site Technique Total Dose (Gy) Dose per Fx (Gy) Completed Fx Beam Energies  Breast, Left: Breast_Lt 3D 40.05/40.05 2.67 15/15 6X  Breast, Left: Breast_Lt_Bst 3D 10/10 2 5/5 6X   Narrative: The patient tolerated radiation therapy relatively well.   Plan: The patient will follow-up with radiation oncology in 33mo, or as needed.  -----------------------------------  Eppie Gibson, MD

## 2020-07-14 ENCOUNTER — Telehealth: Payer: Self-pay | Admitting: *Deleted

## 2020-07-14 NOTE — Telephone Encounter (Signed)
Pt called stating that she had a vomiting spell a few days ago after eating & then taking vitamin's with milk.  She is getting Trastuzumab.  Informed that we could call her in something for nausea but she hesitated to do that just yet.  She states she will call back if it happens again.  Informed that we could call something in to have on hand if she needed it but she declined.

## 2020-07-16 ENCOUNTER — Other Ambulatory Visit: Payer: Self-pay

## 2020-07-16 ENCOUNTER — Inpatient Hospital Stay: Payer: Medicare Other

## 2020-07-16 ENCOUNTER — Inpatient Hospital Stay: Payer: Medicare Other | Attending: Oncology

## 2020-07-16 VITALS — BP 162/73 | HR 77 | Temp 98.1°F | Resp 18

## 2020-07-16 DIAGNOSIS — Z170 Estrogen receptor positive status [ER+]: Secondary | ICD-10-CM | POA: Diagnosis not present

## 2020-07-16 DIAGNOSIS — C50212 Malignant neoplasm of upper-inner quadrant of left female breast: Secondary | ICD-10-CM | POA: Insufficient documentation

## 2020-07-16 DIAGNOSIS — Z79899 Other long term (current) drug therapy: Secondary | ICD-10-CM | POA: Diagnosis not present

## 2020-07-16 DIAGNOSIS — Z95828 Presence of other vascular implants and grafts: Secondary | ICD-10-CM

## 2020-07-16 DIAGNOSIS — Z87891 Personal history of nicotine dependence: Secondary | ICD-10-CM | POA: Diagnosis not present

## 2020-07-16 DIAGNOSIS — Z5112 Encounter for antineoplastic immunotherapy: Secondary | ICD-10-CM | POA: Diagnosis not present

## 2020-07-16 LAB — COMPREHENSIVE METABOLIC PANEL
ALT: 20 U/L (ref 0–44)
AST: 23 U/L (ref 15–41)
Albumin: 3.3 g/dL — ABNORMAL LOW (ref 3.5–5.0)
Alkaline Phosphatase: 88 U/L (ref 38–126)
Anion gap: 4 — ABNORMAL LOW (ref 5–15)
BUN: 14 mg/dL (ref 8–23)
CO2: 29 mmol/L (ref 22–32)
Calcium: 9.2 mg/dL (ref 8.9–10.3)
Chloride: 104 mmol/L (ref 98–111)
Creatinine, Ser: 0.78 mg/dL (ref 0.44–1.00)
GFR calc non Af Amer: >60 mL/min (ref >60)
Glucose, Bld: 87 mg/dL (ref 70–99)
Potassium: 4.5 mmol/L (ref 3.5–5.1)
Sodium: 137 mmol/L (ref 135–145)
Total Bilirubin: 0.5 mg/dL (ref 0.3–1.2)
Total Protein: 6.8 g/dL (ref 6.5–8.1)

## 2020-07-16 LAB — CBC WITH DIFFERENTIAL/PLATELET
Abs Immature Granulocytes: 0.02 10*3/uL (ref 0.00–0.07)
Basophils Absolute: 0 10*3/uL (ref 0.0–0.1)
Basophils Relative: 0 %
Eosinophils Absolute: 0.3 10*3/uL (ref 0.0–0.5)
Eosinophils Relative: 7 %
HCT: 36.8 % (ref 36.0–46.0)
Hemoglobin: 12.1 g/dL (ref 12.0–15.0)
Immature Granulocytes: 0 %
Lymphocytes Relative: 14 %
Lymphs Abs: 0.7 10*3/uL (ref 0.7–4.0)
MCH: 30 pg (ref 26.0–34.0)
MCHC: 32.9 g/dL (ref 30.0–36.0)
MCV: 91.3 fL (ref 80.0–100.0)
Monocytes Absolute: 0.7 10*3/uL (ref 0.1–1.0)
Monocytes Relative: 13 %
Neutro Abs: 3.3 10*3/uL (ref 1.7–7.7)
Neutrophils Relative %: 66 %
Platelets: 158 10*3/uL (ref 150–400)
RBC: 4.03 MIL/uL (ref 3.87–5.11)
RDW: 12.5 % (ref 11.5–15.5)
WBC: 5.1 10*3/uL (ref 4.0–10.5)
nRBC: 0 % (ref 0.0–0.2)

## 2020-07-16 MED ORDER — SODIUM CHLORIDE 0.9% FLUSH
10.0000 mL | INTRAVENOUS | Status: DC | PRN
  Administered 2020-07-16: 10 mL
  Filled 2020-07-16: qty 10

## 2020-07-16 MED ORDER — DIPHENHYDRAMINE HCL 12.5 MG/5ML PO ELIX
12.5000 mg | ORAL_SOLUTION | Freq: Once | ORAL | Status: AC
  Administered 2020-07-16: 12.5 mg via ORAL

## 2020-07-16 MED ORDER — HEPARIN SOD (PORK) LOCK FLUSH 100 UNIT/ML IV SOLN
500.0000 [IU] | Freq: Once | INTRAVENOUS | Status: AC | PRN
  Administered 2020-07-16: 500 [IU]
  Filled 2020-07-16: qty 5

## 2020-07-16 MED ORDER — ACETAMINOPHEN 325 MG PO TABS
ORAL_TABLET | ORAL | Status: AC
  Filled 2020-07-16: qty 2

## 2020-07-16 MED ORDER — SODIUM CHLORIDE 0.9 % IV SOLN
Freq: Once | INTRAVENOUS | Status: AC
  Filled 2020-07-16: qty 250

## 2020-07-16 MED ORDER — DIPHENHYDRAMINE HCL 12.5 MG/5ML PO ELIX
ORAL_SOLUTION | ORAL | Status: AC
  Filled 2020-07-16: qty 5

## 2020-07-16 MED ORDER — ACETAMINOPHEN 325 MG PO TABS
650.0000 mg | ORAL_TABLET | Freq: Once | ORAL | Status: AC
  Administered 2020-07-16: 650 mg via ORAL

## 2020-07-16 MED ORDER — SODIUM CHLORIDE 0.9% FLUSH
10.0000 mL | Freq: Once | INTRAVENOUS | Status: AC
  Administered 2020-07-16: 10 mL
  Filled 2020-07-16: qty 10

## 2020-07-16 MED ORDER — TRASTUZUMAB-ANNS CHEMO 150 MG IV SOLR
450.0000 mg | Freq: Once | INTRAVENOUS | Status: AC
  Administered 2020-07-16: 450 mg via INTRAVENOUS
  Filled 2020-07-16: qty 21.43

## 2020-07-16 NOTE — Patient Instructions (Signed)
[facility] °Discharge Instructions for Patients Receiving Chemotherapy ° °Today you received the following chemotherapy agents Trastuzumab ° °To help prevent nausea and vomiting after your treatment, we encourage you to take your nausea medication as directed. °  °If you develop nausea and vomiting that is not controlled by your nausea medication, call the clinic.  ° °BELOW ARE SYMPTOMS THAT SHOULD BE REPORTED IMMEDIATELY: °· *FEVER GREATER THAN 100.5 F °· *CHILLS WITH OR WITHOUT FEVER °· NAUSEA AND VOMITING THAT IS NOT CONTROLLED WITH YOUR NAUSEA MEDICATION °· *UNUSUAL SHORTNESS OF BREATH °· *UNUSUAL BRUISING OR BLEEDING °· TENDERNESS IN MOUTH AND THROAT WITH OR WITHOUT PRESENCE OF ULCERS °· *URINARY PROBLEMS °· *BOWEL PROBLEMS °· UNUSUAL RASH °Items with * indicate a potential emergency and should be followed up as soon as possible. ° °Feel free to call the clinic should you have any questions or concerns. The clinic phone number is [phone]. ° °Please show the CHEMO ALERT CARD at check-in to the Emergency Department and triage nurse. ° ° °

## 2020-07-21 ENCOUNTER — Telehealth: Payer: Self-pay

## 2020-07-21 NOTE — Telephone Encounter (Signed)
Pt called to ask about flu shot and COVID booster. Pt will receive flu shot at [place], and this nurse explained to pt that Dr Jana Hakim advises his pt's space their flu shot and COVID boosters 2 weeks apart. Pt verbalized thanks and understanding

## 2020-08-06 ENCOUNTER — Other Ambulatory Visit: Payer: Self-pay

## 2020-08-06 ENCOUNTER — Inpatient Hospital Stay: Payer: Medicare Other

## 2020-08-06 VITALS — BP 173/72 | HR 71 | Temp 97.7°F | Resp 18 | Wt 156.0 lb

## 2020-08-06 DIAGNOSIS — Z5112 Encounter for antineoplastic immunotherapy: Secondary | ICD-10-CM | POA: Diagnosis not present

## 2020-08-06 DIAGNOSIS — C50212 Malignant neoplasm of upper-inner quadrant of left female breast: Secondary | ICD-10-CM

## 2020-08-06 DIAGNOSIS — Z170 Estrogen receptor positive status [ER+]: Secondary | ICD-10-CM

## 2020-08-06 DIAGNOSIS — Z95828 Presence of other vascular implants and grafts: Secondary | ICD-10-CM

## 2020-08-06 LAB — COMPREHENSIVE METABOLIC PANEL
ALT: 21 U/L (ref 0–44)
AST: 22 U/L (ref 15–41)
Albumin: 3.3 g/dL — ABNORMAL LOW (ref 3.5–5.0)
Alkaline Phosphatase: 68 U/L (ref 38–126)
Anion gap: 6 (ref 5–15)
BUN: 17 mg/dL (ref 8–23)
CO2: 28 mmol/L (ref 22–32)
Calcium: 9.2 mg/dL (ref 8.9–10.3)
Chloride: 105 mmol/L (ref 98–111)
Creatinine, Ser: 0.78 mg/dL (ref 0.44–1.00)
GFR, Estimated: >60 mL/min (ref >60)
Glucose, Bld: 90 mg/dL (ref 70–99)
Potassium: 4.4 mmol/L (ref 3.5–5.1)
Sodium: 139 mmol/L (ref 135–145)
Total Bilirubin: 0.4 mg/dL (ref 0.3–1.2)
Total Protein: 6.2 g/dL — ABNORMAL LOW (ref 6.5–8.1)

## 2020-08-06 LAB — CBC WITH DIFFERENTIAL/PLATELET
Abs Immature Granulocytes: 0.01 10*3/uL (ref 0.00–0.07)
Basophils Absolute: 0 10*3/uL (ref 0.0–0.1)
Basophils Relative: 0 %
Eosinophils Absolute: 0.2 10*3/uL (ref 0.0–0.5)
Eosinophils Relative: 4 %
HCT: 36.4 % (ref 36.0–46.0)
Hemoglobin: 11.9 g/dL — ABNORMAL LOW (ref 12.0–15.0)
Immature Granulocytes: 0 %
Lymphocytes Relative: 20 %
Lymphs Abs: 1 10*3/uL (ref 0.7–4.0)
MCH: 29.9 pg (ref 26.0–34.0)
MCHC: 32.7 g/dL (ref 30.0–36.0)
MCV: 91.5 fL (ref 80.0–100.0)
Monocytes Absolute: 0.6 10*3/uL (ref 0.1–1.0)
Monocytes Relative: 14 %
Neutro Abs: 3 10*3/uL (ref 1.7–7.7)
Neutrophils Relative %: 62 %
Platelets: 127 10*3/uL — ABNORMAL LOW (ref 150–400)
RBC: 3.98 MIL/uL (ref 3.87–5.11)
RDW: 12.4 % (ref 11.5–15.5)
WBC: 4.7 10*3/uL (ref 4.0–10.5)
nRBC: 0 % (ref 0.0–0.2)

## 2020-08-06 MED ORDER — TRASTUZUMAB-ANNS CHEMO 150 MG IV SOLR
450.0000 mg | Freq: Once | INTRAVENOUS | Status: AC
  Administered 2020-08-06: 450 mg via INTRAVENOUS
  Filled 2020-08-06: qty 21.43

## 2020-08-06 MED ORDER — SODIUM CHLORIDE 0.9% FLUSH
10.0000 mL | Freq: Once | INTRAVENOUS | Status: AC
  Administered 2020-08-06: 10 mL
  Filled 2020-08-06: qty 10

## 2020-08-06 MED ORDER — ACETAMINOPHEN 325 MG PO TABS
ORAL_TABLET | ORAL | Status: AC
  Filled 2020-08-06: qty 2

## 2020-08-06 MED ORDER — SODIUM CHLORIDE 0.9% FLUSH
10.0000 mL | INTRAVENOUS | Status: DC | PRN
  Administered 2020-08-06: 10 mL
  Filled 2020-08-06: qty 10

## 2020-08-06 MED ORDER — HEPARIN SOD (PORK) LOCK FLUSH 100 UNIT/ML IV SOLN
500.0000 [IU] | Freq: Once | INTRAVENOUS | Status: AC | PRN
  Administered 2020-08-06: 500 [IU]
  Filled 2020-08-06: qty 5

## 2020-08-06 MED ORDER — SODIUM CHLORIDE 0.9 % IV SOLN
Freq: Once | INTRAVENOUS | Status: AC
  Filled 2020-08-06: qty 250

## 2020-08-06 MED ORDER — DIPHENHYDRAMINE HCL 12.5 MG/5ML PO ELIX
12.5000 mg | ORAL_SOLUTION | Freq: Once | ORAL | Status: AC
  Administered 2020-08-06: 12.5 mg via ORAL

## 2020-08-06 MED ORDER — ACETAMINOPHEN 325 MG PO TABS
650.0000 mg | ORAL_TABLET | Freq: Once | ORAL | Status: AC
  Administered 2020-08-06: 650 mg via ORAL

## 2020-08-06 MED ORDER — DIPHENHYDRAMINE HCL 12.5 MG/5ML PO ELIX
ORAL_SOLUTION | ORAL | Status: AC
  Filled 2020-08-06: qty 5

## 2020-08-06 NOTE — Patient Instructions (Signed)
[facility] °Discharge Instructions for Patients Receiving Chemotherapy ° °Today you received the following chemotherapy agents Trastuzumab ° °To help prevent nausea and vomiting after your treatment, we encourage you to take your nausea medication as directed. °  °If you develop nausea and vomiting that is not controlled by your nausea medication, call the clinic.  ° °BELOW ARE SYMPTOMS THAT SHOULD BE REPORTED IMMEDIATELY: °· *FEVER GREATER THAN 100.5 F °· *CHILLS WITH OR WITHOUT FEVER °· NAUSEA AND VOMITING THAT IS NOT CONTROLLED WITH YOUR NAUSEA MEDICATION °· *UNUSUAL SHORTNESS OF BREATH °· *UNUSUAL BRUISING OR BLEEDING °· TENDERNESS IN MOUTH AND THROAT WITH OR WITHOUT PRESENCE OF ULCERS °· *URINARY PROBLEMS °· *BOWEL PROBLEMS °· UNUSUAL RASH °Items with * indicate a potential emergency and should be followed up as soon as possible. ° °Feel free to call the clinic should you have any questions or concerns. The clinic phone number is [phone]. ° °Please show the CHEMO ALERT CARD at check-in to the Emergency Department and triage nurse. ° ° °

## 2020-08-27 ENCOUNTER — Inpatient Hospital Stay: Payer: Medicare Other

## 2020-08-27 ENCOUNTER — Other Ambulatory Visit: Payer: Self-pay

## 2020-08-27 ENCOUNTER — Inpatient Hospital Stay: Payer: Medicare Other | Attending: Oncology

## 2020-08-27 ENCOUNTER — Other Ambulatory Visit (HOSPITAL_COMMUNITY): Payer: Self-pay | Admitting: Oncology

## 2020-08-27 VITALS — BP 177/64 | HR 70 | Temp 97.9°F | Resp 17

## 2020-08-27 DIAGNOSIS — Z170 Estrogen receptor positive status [ER+]: Secondary | ICD-10-CM | POA: Diagnosis not present

## 2020-08-27 DIAGNOSIS — C50212 Malignant neoplasm of upper-inner quadrant of left female breast: Secondary | ICD-10-CM | POA: Insufficient documentation

## 2020-08-27 DIAGNOSIS — Z5112 Encounter for antineoplastic immunotherapy: Secondary | ICD-10-CM | POA: Insufficient documentation

## 2020-08-27 DIAGNOSIS — Z95828 Presence of other vascular implants and grafts: Secondary | ICD-10-CM

## 2020-08-27 LAB — CBC WITH DIFFERENTIAL/PLATELET
Abs Immature Granulocytes: 0.01 10*3/uL (ref 0.00–0.07)
Basophils Absolute: 0 10*3/uL (ref 0.0–0.1)
Basophils Relative: 1 %
Eosinophils Absolute: 0.2 10*3/uL (ref 0.0–0.5)
Eosinophils Relative: 4 %
HCT: 36.9 % (ref 36.0–46.0)
Hemoglobin: 12 g/dL (ref 12.0–15.0)
Immature Granulocytes: 0 %
Lymphocytes Relative: 26 %
Lymphs Abs: 1.2 10*3/uL (ref 0.7–4.0)
MCH: 29.6 pg (ref 26.0–34.0)
MCHC: 32.5 g/dL (ref 30.0–36.0)
MCV: 91.1 fL (ref 80.0–100.0)
Monocytes Absolute: 0.6 10*3/uL (ref 0.1–1.0)
Monocytes Relative: 13 %
Neutro Abs: 2.6 10*3/uL (ref 1.7–7.7)
Neutrophils Relative %: 56 %
Platelets: 146 10*3/uL — ABNORMAL LOW (ref 150–400)
RBC: 4.05 MIL/uL (ref 3.87–5.11)
RDW: 12.3 % (ref 11.5–15.5)
WBC: 4.5 10*3/uL (ref 4.0–10.5)
nRBC: 0 % (ref 0.0–0.2)

## 2020-08-27 LAB — COMPREHENSIVE METABOLIC PANEL
ALT: 21 U/L (ref 0–44)
AST: 24 U/L (ref 15–41)
Albumin: 3.5 g/dL (ref 3.5–5.0)
Alkaline Phosphatase: 66 U/L (ref 38–126)
Anion gap: 5 (ref 5–15)
BUN: 14 mg/dL (ref 8–23)
CO2: 31 mmol/L (ref 22–32)
Calcium: 9.3 mg/dL (ref 8.9–10.3)
Chloride: 105 mmol/L (ref 98–111)
Creatinine, Ser: 0.83 mg/dL (ref 0.44–1.00)
GFR, Estimated: >60 mL/min (ref >60)
Glucose, Bld: 86 mg/dL (ref 70–99)
Potassium: 4.3 mmol/L (ref 3.5–5.1)
Sodium: 141 mmol/L (ref 135–145)
Total Bilirubin: 0.4 mg/dL (ref 0.3–1.2)
Total Protein: 6.4 g/dL — ABNORMAL LOW (ref 6.5–8.1)

## 2020-08-27 MED ORDER — SODIUM CHLORIDE 0.9 % IV SOLN
Freq: Once | INTRAVENOUS | Status: AC
  Filled 2020-08-27: qty 250

## 2020-08-27 MED ORDER — DIPHENHYDRAMINE HCL 12.5 MG/5ML PO ELIX
ORAL_SOLUTION | ORAL | Status: AC
  Filled 2020-08-27: qty 5

## 2020-08-27 MED ORDER — DIPHENHYDRAMINE HCL 12.5 MG/5ML PO ELIX
12.5000 mg | ORAL_SOLUTION | Freq: Once | ORAL | Status: AC
  Administered 2020-08-27: 12.5 mg via ORAL

## 2020-08-27 MED ORDER — TRASTUZUMAB-ANNS CHEMO 150 MG IV SOLR
450.0000 mg | Freq: Once | INTRAVENOUS | Status: AC
  Administered 2020-08-27: 450 mg via INTRAVENOUS
  Filled 2020-08-27: qty 21.43

## 2020-08-27 MED ORDER — SODIUM CHLORIDE 0.9% FLUSH
10.0000 mL | INTRAVENOUS | Status: DC | PRN
  Administered 2020-08-27: 10 mL
  Filled 2020-08-27: qty 10

## 2020-08-27 MED ORDER — SODIUM CHLORIDE 0.9% FLUSH
10.0000 mL | Freq: Once | INTRAVENOUS | Status: AC
  Administered 2020-08-27: 10 mL
  Filled 2020-08-27: qty 10

## 2020-08-27 MED ORDER — HEPARIN SOD (PORK) LOCK FLUSH 100 UNIT/ML IV SOLN
500.0000 [IU] | Freq: Once | INTRAVENOUS | Status: AC | PRN
  Administered 2020-08-27: 500 [IU]
  Filled 2020-08-27: qty 5

## 2020-08-27 MED ORDER — ACETAMINOPHEN 325 MG PO TABS
ORAL_TABLET | ORAL | Status: AC
  Filled 2020-08-27: qty 2

## 2020-08-27 MED ORDER — ACETAMINOPHEN 325 MG PO TABS
650.0000 mg | ORAL_TABLET | Freq: Once | ORAL | Status: AC
  Administered 2020-08-27: 650 mg via ORAL

## 2020-08-27 NOTE — Patient Instructions (Signed)
[facility] °Discharge Instructions for Patients Receiving Chemotherapy ° °Today you received the following chemotherapy agents Trastuzumab ° °To help prevent nausea and vomiting after your treatment, we encourage you to take your nausea medication as directed. °  °If you develop nausea and vomiting that is not controlled by your nausea medication, call the clinic.  ° °BELOW ARE SYMPTOMS THAT SHOULD BE REPORTED IMMEDIATELY: °· *FEVER GREATER THAN 100.5 F °· *CHILLS WITH OR WITHOUT FEVER °· NAUSEA AND VOMITING THAT IS NOT CONTROLLED WITH YOUR NAUSEA MEDICATION °· *UNUSUAL SHORTNESS OF BREATH °· *UNUSUAL BRUISING OR BLEEDING °· TENDERNESS IN MOUTH AND THROAT WITH OR WITHOUT PRESENCE OF ULCERS °· *URINARY PROBLEMS °· *BOWEL PROBLEMS °· UNUSUAL RASH °Items with * indicate a potential emergency and should be followed up as soon as possible. ° °Feel free to call the clinic should you have any questions or concerns. The clinic phone number is [phone]. ° °Please show the CHEMO ALERT CARD at check-in to the Emergency Department and triage nurse. ° ° °

## 2020-09-08 ENCOUNTER — Other Ambulatory Visit: Payer: Self-pay

## 2020-09-08 ENCOUNTER — Ambulatory Visit (HOSPITAL_COMMUNITY)
Admission: RE | Admit: 2020-09-08 | Discharge: 2020-09-08 | Disposition: A | Discharge status: Home / Self Care | Payer: Medicare Other | Source: Ambulatory Visit | Attending: Oncology | Admitting: Oncology

## 2020-09-08 DIAGNOSIS — E785 Hyperlipidemia, unspecified: Secondary | ICD-10-CM | POA: Diagnosis not present

## 2020-09-08 DIAGNOSIS — Z01818 Encounter for other preprocedural examination: Secondary | ICD-10-CM | POA: Diagnosis not present

## 2020-09-08 DIAGNOSIS — C50212 Malignant neoplasm of upper-inner quadrant of left female breast: Secondary | ICD-10-CM | POA: Diagnosis not present

## 2020-09-08 DIAGNOSIS — Z170 Estrogen receptor positive status [ER+]: Secondary | ICD-10-CM | POA: Diagnosis not present

## 2020-09-08 DIAGNOSIS — I10 Essential (primary) hypertension: Secondary | ICD-10-CM | POA: Diagnosis not present

## 2020-09-08 DIAGNOSIS — Z0189 Encounter for other specified special examinations: Secondary | ICD-10-CM | POA: Diagnosis not present

## 2020-09-08 LAB — ECHOCARDIOGRAM COMPLETE
Area-P 1/2: 2.24 cm2
S' Lateral: 2.4 cm

## 2020-09-08 NOTE — Progress Notes (Signed)
  Echocardiogram 2D Echocardiogram has been performed.  Michelle [name] 09/08/2020, 11:03 AM

## 2020-09-15 ENCOUNTER — Ambulatory Visit: Payer: Self-pay | Admitting: Surgery

## 2020-09-15 NOTE — H&P (View-Only) (Signed)
[name] Appointment: 09/15/2020 11:50 AM Location: [facility] Patient #: [id number] DOB: 1931-04-05 Single / Language: [name] / Race: White Female  History of Present Illness Marcello Moores A. Shaylyn Bawa MD; 09/15/2020 12:00 PM) Patient words: Patient words: Patient returns after left breast lumpectomy and sentinel lymph node mapping for her T2 N0 MX left breast cancer. She is doing well without complaint. She will complete Herceptin this week.  The patient is a 86 year old female.   Allergies (Tanisha A. Owens Shark, RMA; 09/15/2020 11:45 AM) Atorvastatin Calcium *ANTIHYPERLIPIDEMICS* Hallucinations Pravastatin Sodium *ANTIHYPERLIPIDEMICS* Hallucinations Allergies Reconciled  Medication History (Tanisha A. Owens Shark, RMA; 09/15/2020 11:45 AM) Losartan Potassium (25MG  Tablet, Oral) Active. Anastrozole (1MG  Tablet, Oral) Active. Xanax (0.25MG  Tablet, Oral) Active. Aspirin (81MG  Tablet DR, Oral) Active. Calcium Carbonate-Vitamin D (500-400MG -UNIT Tablet, Oral) Active. NexIUM (20MG  Capsule DR, Oral) Active. HydroDiuril (25MG  Tablet, Oral) Active. Antivert (12.5MG  Tablet, Oral) Active. Synthroid (75MCG Tablet, Oral) Active. Multiple Vitamin (1 (one) Oral) Active. Medications Reconciled    Vitals (Tanisha A. Brown RMA; 09/15/2020 11:45 AM) 09/15/2020 11:45 AM Weight: 157.6 lb Height: 64in Body Surface Area: 1.77 m Body Mass Index: 27.05 kg/m  Temp.: 97.34F  Pulse: 88 (Regular)  BP: 134/82(Sitting, Left Arm, Standard)        Physical Exam ([place] Jaquith A. Noa Constante MD; 09/15/2020 12:03 PM)  General Mental Status-Alert. General Appearance-Consistent with stated age. Hydration-Well hydrated. Voice-Normal.  Chest and Lung Exam Chest and lung exam reveals -quiet, even and easy respiratory effort with no use of accessory muscles and on auscultation, normal breath sounds, no adventitious sounds and normal vocal resonance. Inspection Chest Wall -  Normal. Back - normal. Note: port in place right subclavian  Breast Note: Left breast incision is healed. There is some dimpling noted at the incision. No masses in either breast.  Neurologic Neurologic evaluation reveals -alert and oriented x 3 with no impairment of recent or remote memory. Mental Status-Normal.  Musculoskeletal Normal Exam - Left-Upper Extremity Strength Normal and Lower Extremity Strength Normal. Normal Exam - Right-Upper Extremity Strength Normal and Lower Extremity Strength Normal.  Lymphatic Axillary  General Axillary Region: Bilateral - Description - Normal. Tenderness - Non Tender.    Assessment & Plan (Vallory Oetken A. Alyssia Heese MD; 09/15/2020 12:03 PM)  BREAST CANCER, LEFT (C50.912) Impression: Doing well Scheduled for port removal.  Current Plans You are being scheduled for surgery- Our schedulers will call you.  You should hear from our office's scheduling department within 5 working days about the location, date, and time of surgery. We try to make accommodations for patient's preferences in scheduling surgery, but sometimes the OR schedule or the surgeon's schedule prevents [place] from making those accommodations.  If you have not heard from our office [phone]) in 5 working days, call the office and ask for your surgeon's nurse.  If you have other questions about your diagnosis, plan, or surgery, call the office and ask for your surgeon's nurse.  Use of a central venous catheter for intravenous therapy was discussed. Technique of catheter placement using ultrasound and fluoroscopy guidance was discussed. Risks such as bleeding, infection, pneumothorax, catheter occlusion, reoperation, and other risks were discussed. I noted a good likelihood this will help address the problem. Questions were answered. The patient expressed understanding & wishes to proceed

## 2020-09-15 NOTE — H&P (Signed)
Michelle [name] Appointment: 09/15/2020 11:50 AM Location: [facility] Patient #: [id number] DOB: 31-Mar-1931 Single / Language: Cleophus [name] / Race: White Female  History of Present Illness Marcello Moores A. Leston Schueller MD; 09/15/2020 12:00 PM) Patient words: Patient words: Patient returns after left breast lumpectomy and sentinel lymph node mapping for her T2 N0 MX left breast cancer. She is doing well without complaint. She will complete Herceptin this week.  The patient is a 86 year old female.   Allergies (Tanisha A. Owens Shark, RMA; 09/15/2020 11:45 AM) Atorvastatin Calcium *ANTIHYPERLIPIDEMICS* Hallucinations Pravastatin Sodium *ANTIHYPERLIPIDEMICS* Hallucinations Allergies Reconciled  Medication History (Tanisha A. Owens Shark, RMA; 09/15/2020 11:45 AM) Losartan Potassium (25MG  Tablet, Oral) Active. Anastrozole (1MG  Tablet, Oral) Active. Xanax (0.25MG  Tablet, Oral) Active. Aspirin (81MG  Tablet DR, Oral) Active. Calcium Carbonate-Vitamin D (500-400MG -UNIT Tablet, Oral) Active. NexIUM (20MG  Capsule DR, Oral) Active. HydroDiuril (25MG  Tablet, Oral) Active. Antivert (12.5MG  Tablet, Oral) Active. Synthroid (75MCG Tablet, Oral) Active. Multiple Vitamin (1 (one) Oral) Active. Medications Reconciled    Vitals (Tanisha A. Brown RMA; 09/15/2020 11:45 AM) 09/15/2020 11:45 AM Weight: 157.6 lb Height: 64in Body Surface Area: 1.77 m Body Mass Index: 27.05 kg/m  Temp.: 97.55F  Pulse: 88 (Regular)  BP: 134/82(Sitting, Left Arm, Standard)        Physical Exam (Neelam Tiggs A. Marrissa Dai MD; 09/15/2020 12:03 PM)  General Mental Status-Alert. General Appearance-Consistent with stated age. Hydration-Well hydrated. Voice-Normal.  Chest and Lung Exam Chest and lung exam reveals -quiet, even and easy respiratory effort with no use of accessory muscles and on auscultation, normal breath sounds, no adventitious sounds and normal vocal resonance. Inspection Chest Wall -  Normal. Back - normal. Note: port in place right subclavian  Breast Note: Left breast incision is healed. There is some dimpling noted at the incision. No masses in either breast.  Neurologic Neurologic evaluation reveals -alert and oriented x 3 with no impairment of recent or remote memory. Mental Status-Normal.  Musculoskeletal Normal Exam - Left-Upper Extremity Strength Normal and Lower Extremity Strength Normal. Normal Exam - Right-Upper Extremity Strength Normal and Lower Extremity Strength Normal.  Lymphatic Axillary  General Axillary Region: Bilateral - Description - Normal. Tenderness - Non Tender.    Assessment & Plan (Odell Fasching A. Dailah Opperman MD; 09/15/2020 12:03 PM)  BREAST CANCER, LEFT (C50.912) Impression: Doing well Scheduled for port removal.  Current Plans You are being scheduled for surgery- Our schedulers will call you.  You should hear from our office's scheduling department within 5 working days about the location, date, and time of surgery. We try to make accommodations for patient's preferences in scheduling surgery, but sometimes the OR schedule or the surgeon's schedule prevents [place] from making those accommodations.  If you have not heard from our office [phone] in 5 working days, call the office and ask for your surgeon's nurse.  If you have other questions about your diagnosis, plan, or surgery, call the office and ask for your surgeon's nurse.  Use of a central venous catheter for intravenous therapy was discussed. Technique of catheter placement using ultrasound and fluoroscopy guidance was discussed. Risks such as bleeding, infection, pneumothorax, catheter occlusion, reoperation, and other risks were discussed. I noted a good likelihood this will help address the problem. Questions were answered. The patient expressed understanding & wishes to proceed

## 2020-09-17 ENCOUNTER — Other Ambulatory Visit: Payer: Self-pay

## 2020-09-17 ENCOUNTER — Encounter: Payer: Self-pay | Admitting: *Deleted

## 2020-09-17 ENCOUNTER — Inpatient Hospital Stay: Payer: Medicare Other | Attending: Oncology

## 2020-09-17 ENCOUNTER — Inpatient Hospital Stay (HOSPITAL_BASED_OUTPATIENT_CLINIC_OR_DEPARTMENT_OTHER): Payer: Medicare Other | Admitting: Oncology

## 2020-09-17 ENCOUNTER — Inpatient Hospital Stay: Payer: Medicare Other

## 2020-09-17 VITALS — BP 145/59 | HR 68 | Temp 98.1°F | Resp 17 | Ht 65.0 in | Wt 155.8 lb

## 2020-09-17 DIAGNOSIS — Z170 Estrogen receptor positive status [ER+]: Secondary | ICD-10-CM

## 2020-09-17 DIAGNOSIS — Z87891 Personal history of nicotine dependence: Secondary | ICD-10-CM | POA: Insufficient documentation

## 2020-09-17 DIAGNOSIS — I10 Essential (primary) hypertension: Secondary | ICD-10-CM | POA: Diagnosis not present

## 2020-09-17 DIAGNOSIS — Z5112 Encounter for antineoplastic immunotherapy: Secondary | ICD-10-CM | POA: Diagnosis present

## 2020-09-17 DIAGNOSIS — Z95828 Presence of other vascular implants and grafts: Secondary | ICD-10-CM

## 2020-09-17 DIAGNOSIS — Z79899 Other long term (current) drug therapy: Secondary | ICD-10-CM | POA: Diagnosis not present

## 2020-09-17 DIAGNOSIS — Z79811 Long term (current) use of aromatase inhibitors: Secondary | ICD-10-CM | POA: Insufficient documentation

## 2020-09-17 DIAGNOSIS — C50212 Malignant neoplasm of upper-inner quadrant of left female breast: Secondary | ICD-10-CM | POA: Insufficient documentation

## 2020-09-17 DIAGNOSIS — E785 Hyperlipidemia, unspecified: Secondary | ICD-10-CM | POA: Insufficient documentation

## 2020-09-17 LAB — COMPREHENSIVE METABOLIC PANEL
ALT: 18 U/L (ref 0–44)
AST: 21 U/L (ref 15–41)
Albumin: 3.4 g/dL — ABNORMAL LOW (ref 3.5–5.0)
Alkaline Phosphatase: 67 U/L (ref 38–126)
Anion gap: 6 (ref 5–15)
BUN: 13 mg/dL (ref 8–23)
CO2: 30 mmol/L (ref 22–32)
Calcium: 9.4 mg/dL (ref 8.9–10.3)
Chloride: 103 mmol/L (ref 98–111)
Creatinine, Ser: 0.85 mg/dL (ref 0.44–1.00)
GFR, Estimated: >60 mL/min (ref >60)
Glucose, Bld: 97 mg/dL (ref 70–99)
Potassium: 4.2 mmol/L (ref 3.5–5.1)
Sodium: 139 mmol/L (ref 135–145)
Total Bilirubin: 0.6 mg/dL (ref 0.3–1.2)
Total Protein: 6.5 g/dL (ref 6.5–8.1)

## 2020-09-17 LAB — CBC WITH DIFFERENTIAL/PLATELET
Abs Immature Granulocytes: 0.01 10*3/uL (ref 0.00–0.07)
Basophils Absolute: 0 10*3/uL (ref 0.0–0.1)
Basophils Relative: 0 %
Eosinophils Absolute: 0.2 10*3/uL (ref 0.0–0.5)
Eosinophils Relative: 3 %
HCT: 37.8 % (ref 36.0–46.0)
Hemoglobin: 12.4 g/dL (ref 12.0–15.0)
Immature Granulocytes: 0 %
Lymphocytes Relative: 20 %
Lymphs Abs: 1 10*3/uL (ref 0.7–4.0)
MCH: 29.4 pg (ref 26.0–34.0)
MCHC: 32.8 g/dL (ref 30.0–36.0)
MCV: 89.6 fL (ref 80.0–100.0)
Monocytes Absolute: 0.8 10*3/uL (ref 0.1–1.0)
Monocytes Relative: 15 %
Neutro Abs: 3.3 10*3/uL (ref 1.7–7.7)
Neutrophils Relative %: 62 %
Platelets: 141 10*3/uL — ABNORMAL LOW (ref 150–400)
RBC: 4.22 MIL/uL (ref 3.87–5.11)
RDW: 12.8 % (ref 11.5–15.5)
WBC: 5.2 10*3/uL (ref 4.0–10.5)
nRBC: 0 % (ref 0.0–0.2)

## 2020-09-17 MED ORDER — TRASTUZUMAB-ANNS CHEMO 150 MG IV SOLR
450.0000 mg | Freq: Once | INTRAVENOUS | Status: AC
  Administered 2020-09-17: 450 mg via INTRAVENOUS
  Filled 2020-09-17: qty 21.43

## 2020-09-17 MED ORDER — ACETAMINOPHEN 325 MG PO TABS
ORAL_TABLET | ORAL | Status: AC
  Filled 2020-09-17: qty 2

## 2020-09-17 MED ORDER — SODIUM CHLORIDE 0.9% FLUSH
10.0000 mL | INTRAVENOUS | Status: DC | PRN
  Filled 2020-09-17: qty 10

## 2020-09-17 MED ORDER — HEPARIN SOD (PORK) LOCK FLUSH 100 UNIT/ML IV SOLN
500.0000 [IU] | Freq: Once | INTRAVENOUS | Status: DC | PRN
  Filled 2020-09-17: qty 5

## 2020-09-17 MED ORDER — ACETAMINOPHEN 325 MG PO TABS
650.0000 mg | ORAL_TABLET | Freq: Once | ORAL | Status: AC
  Administered 2020-09-17: 650 mg via ORAL

## 2020-09-17 MED ORDER — DIPHENHYDRAMINE HCL 12.5 MG/5ML PO ELIX
ORAL_SOLUTION | ORAL | Status: AC
  Filled 2020-09-17: qty 5

## 2020-09-17 MED ORDER — SODIUM CHLORIDE 0.9 % IV SOLN
Freq: Once | INTRAVENOUS | Status: AC
  Filled 2020-09-17: qty 250

## 2020-09-17 MED ORDER — DIPHENHYDRAMINE HCL 12.5 MG/5ML PO ELIX
12.5000 mg | ORAL_SOLUTION | Freq: Once | ORAL | Status: AC
  Administered 2020-09-17: 12.5 mg via ORAL

## 2020-09-17 MED ORDER — SODIUM CHLORIDE 0.9% FLUSH
10.0000 mL | Freq: Once | INTRAVENOUS | Status: AC
  Administered 2020-09-17: 10 mL
  Filled 2020-09-17: qty 10

## 2020-09-17 NOTE — Patient Instructions (Signed)
[facility] °Discharge Instructions for Patients Receiving Chemotherapy ° °Today you received the following chemotherapy agents Trastuzumab ° °To help prevent nausea and vomiting after your treatment, we encourage you to take your nausea medication as directed. °  °If you develop nausea and vomiting that is not controlled by your nausea medication, call the clinic.  ° °BELOW ARE SYMPTOMS THAT SHOULD BE REPORTED IMMEDIATELY: °· *FEVER GREATER THAN 100.5 F °· *CHILLS WITH OR WITHOUT FEVER °· NAUSEA AND VOMITING THAT IS NOT CONTROLLED WITH YOUR NAUSEA MEDICATION °· *UNUSUAL SHORTNESS OF BREATH °· *UNUSUAL BRUISING OR BLEEDING °· TENDERNESS IN MOUTH AND THROAT WITH OR WITHOUT PRESENCE OF ULCERS °· *URINARY PROBLEMS °· *BOWEL PROBLEMS °· UNUSUAL RASH °Items with * indicate a potential emergency and should be followed up as soon as possible. ° °Feel free to call the clinic should you have any questions or concerns. The clinic phone number is [phone]. ° °Please show the CHEMO ALERT CARD at check-in to the Emergency Department and triage nurse. ° ° °
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
## 2020-09-17 NOTE — Progress Notes (Signed)
Pt discharged in no apparent distress. Pt left ambulatory without assistance. Pt aware of discharge instructions and verbalized understanding and had no further questions.  

## 2020-09-18 ENCOUNTER — Telehealth: Payer: Self-pay | Admitting: Oncology

## 2020-09-18 NOTE — Telephone Encounter (Signed)
Scheduled appts per 12/9 los. Pt confirmed appt date and time. Pt had questions regarding her rx, transferred pt to desk nurse.

## 2020-09-21 ENCOUNTER — Ambulatory Visit: Payer: Self-pay | Admitting: Surgery

## 2020-10-05 ENCOUNTER — Encounter (HOSPITAL_BASED_OUTPATIENT_CLINIC_OR_DEPARTMENT_OTHER): Payer: Self-pay | Admitting: Surgery

## 2020-10-05 ENCOUNTER — Other Ambulatory Visit: Payer: Self-pay

## 2020-10-12 ENCOUNTER — Other Ambulatory Visit (HOSPITAL_COMMUNITY): Payer: Medicare Other

## 2020-10-12 ENCOUNTER — Emergency Department (HOSPITAL_COMMUNITY)
Admission: EM | Admit: 2020-10-12 | Discharge: 2020-10-12 | Disposition: A | Discharge status: Home / Self Care | Payer: Medicare Other | Attending: Emergency Medicine | Admitting: Emergency Medicine

## 2020-10-12 ENCOUNTER — Emergency Department (HOSPITAL_COMMUNITY): Payer: Medicare Other

## 2020-10-12 DIAGNOSIS — E039 Hypothyroidism, unspecified: Secondary | ICD-10-CM | POA: Diagnosis not present

## 2020-10-12 DIAGNOSIS — Z20822 Contact with and (suspected) exposure to covid-19: Secondary | ICD-10-CM | POA: Diagnosis not present

## 2020-10-12 DIAGNOSIS — Z90710 Acquired absence of both cervix and uterus: Secondary | ICD-10-CM | POA: Insufficient documentation

## 2020-10-12 DIAGNOSIS — R194 Change in bowel habit: Secondary | ICD-10-CM | POA: Insufficient documentation

## 2020-10-12 DIAGNOSIS — Z853 Personal history of malignant neoplasm of breast: Secondary | ICD-10-CM | POA: Diagnosis not present

## 2020-10-12 DIAGNOSIS — Z87891 Personal history of nicotine dependence: Secondary | ICD-10-CM | POA: Insufficient documentation

## 2020-10-12 DIAGNOSIS — Z7982 Long term (current) use of aspirin: Secondary | ICD-10-CM | POA: Diagnosis not present

## 2020-10-12 DIAGNOSIS — I10 Essential (primary) hypertension: Secondary | ICD-10-CM | POA: Insufficient documentation

## 2020-10-12 DIAGNOSIS — R198 Other specified symptoms and signs involving the digestive system and abdomen: Secondary | ICD-10-CM

## 2020-10-12 DIAGNOSIS — Z79899 Other long term (current) drug therapy: Secondary | ICD-10-CM | POA: Insufficient documentation

## 2020-10-12 DIAGNOSIS — K5900 Constipation, unspecified: Secondary | ICD-10-CM | POA: Diagnosis present

## 2020-10-12 LAB — URINALYSIS, ROUTINE W REFLEX MICROSCOPIC
Bilirubin Urine: NEGATIVE
Glucose, UA: NEGATIVE mg/dL
Hgb urine dipstick: NEGATIVE
Ketones, ur: NEGATIVE mg/dL
Nitrite: NEGATIVE
Protein, ur: NEGATIVE mg/dL
Specific Gravity, Urine: 1.01 (ref 1.005–1.030)
pH: 5 (ref 5.0–8.0)

## 2020-10-12 LAB — CBC
HCT: 40.1 % (ref 36.0–46.0)
Hemoglobin: 12.9 g/dL (ref 12.0–15.0)
MCH: 29.6 pg (ref 26.0–34.0)
MCHC: 32.2 g/dL (ref 30.0–36.0)
MCV: 92 fL (ref 80.0–100.0)
Platelets: 135 10*3/uL — ABNORMAL LOW (ref 150–400)
RBC: 4.36 MIL/uL (ref 3.87–5.11)
RDW: 12.6 % (ref 11.5–15.5)
WBC: 5.2 10*3/uL (ref 4.0–10.5)
nRBC: 0 % (ref 0.0–0.2)

## 2020-10-12 LAB — COMPREHENSIVE METABOLIC PANEL
ALT: 21 U/L (ref 0–44)
AST: 22 U/L (ref 15–41)
Albumin: 3.7 g/dL (ref 3.5–5.0)
Alkaline Phosphatase: 61 U/L (ref 38–126)
Anion gap: 12 (ref 5–15)
BUN: 9 mg/dL (ref 8–23)
CO2: 26 mmol/L (ref 22–32)
Calcium: 9.1 mg/dL (ref 8.9–10.3)
Chloride: 97 mmol/L — ABNORMAL LOW (ref 98–111)
Creatinine, Ser: 0.9 mg/dL (ref 0.44–1.00)
GFR, Estimated: >60 mL/min (ref >60)
Glucose, Bld: 106 mg/dL — ABNORMAL HIGH (ref 70–99)
Potassium: 3.8 mmol/L (ref 3.5–5.1)
Sodium: 135 mmol/L (ref 135–145)
Total Bilirubin: 0.9 mg/dL (ref 0.3–1.2)
Total Protein: 6.7 g/dL (ref 6.5–8.1)

## 2020-10-12 LAB — SARS CORONAVIRUS 2 (TAT 6-24 HRS): SARS Coronavirus 2: NEGATIVE

## 2020-10-12 LAB — LIPASE, BLOOD: Lipase: 28 U/L (ref 11–51)
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
## 2020-10-12 NOTE — ED Triage Notes (Signed)
Pt transported from [facility] with intermittent constipation diarrhea since starting chemo for breast cancer, pt reports she uses [facility] to have a BM. Denies pain/n/v.

## 2020-10-12 NOTE — Discharge Instructions (Signed)
You can use MiraLAX, 1 dose, usually a packet or capful is usually how it is dosed. Try this once daily follow-up with your primary care providers. Your Covid test is pending the results of which should be available in a few hours. Your doctor should be able to see it.

## 2020-10-14 ENCOUNTER — Ambulatory Visit (HOSPITAL_BASED_OUTPATIENT_CLINIC_OR_DEPARTMENT_OTHER)
Admission: RE | Admit: 2020-10-14 | Discharge: 2020-10-14 | Disposition: A | Discharge status: Home / Self Care | Payer: Medicare Other | Attending: Surgery | Admitting: Surgery

## 2020-10-14 ENCOUNTER — Ambulatory Visit (HOSPITAL_BASED_OUTPATIENT_CLINIC_OR_DEPARTMENT_OTHER): Payer: Medicare Other | Admitting: Certified Registered"

## 2020-10-14 ENCOUNTER — Encounter (HOSPITAL_BASED_OUTPATIENT_CLINIC_OR_DEPARTMENT_OTHER)
Admission: RE | Disposition: A | Discharge status: Home / Self Care | Payer: Self-pay | Source: Home / Self Care | Attending: Surgery

## 2020-10-14 ENCOUNTER — Encounter (HOSPITAL_BASED_OUTPATIENT_CLINIC_OR_DEPARTMENT_OTHER): Payer: Self-pay | Admitting: Surgery

## 2020-10-14 DIAGNOSIS — Z452 Encounter for adjustment and management of vascular access device: Secondary | ICD-10-CM | POA: Diagnosis present

## 2020-10-14 DIAGNOSIS — Z888 Allergy status to other drugs, medicaments and biological substances status: Secondary | ICD-10-CM | POA: Insufficient documentation

## 2020-10-14 DIAGNOSIS — C50912 Malignant neoplasm of unspecified site of left female breast: Secondary | ICD-10-CM | POA: Insufficient documentation

## 2020-10-14 HISTORY — PX: PORT-A-CATH REMOVAL: SHX5289

## 2020-10-14 HISTORY — DX: Anxiety disorder, unspecified: F41.9

## 2020-10-14 SURGERY — MINOR REMOVAL PORT-A-CATH
Anesthesia: Monitor Anesthesia Care | Site: Chest | Laterality: Right

## 2020-10-14 MED ORDER — LIDOCAINE HCL (PF) 1 % IJ SOLN
INTRAMUSCULAR | Status: AC
  Filled 2020-10-14: qty 30

## 2020-10-14 MED ORDER — LACTATED RINGERS IV SOLN: INTRAVENOUS | Status: DC

## 2020-10-14 MED ORDER — CHLORHEXIDINE GLUCONATE CLOTH 2 % EX PADS: 6.0000 | MEDICATED_PAD | Freq: Once | CUTANEOUS | Status: DC

## 2020-10-14 MED ORDER — BUPIVACAINE-EPINEPHRINE 0.25% -1:200000 IJ SOLN
INTRAMUSCULAR | Status: DC | PRN
  Administered 2020-10-14: 10 mL

## 2020-10-14 MED ORDER — CEFAZOLIN SODIUM-DEXTROSE 2-4 GM/100ML-% IV SOLN
INTRAVENOUS | Status: AC
  Filled 2020-10-14: qty 100

## 2020-10-14 MED ORDER — DEXAMETHASONE SODIUM PHOSPHATE 10 MG/ML IJ SOLN
INTRAMUSCULAR | Status: AC
  Filled 2020-10-14: qty 2

## 2020-10-14 MED ORDER — CEFAZOLIN SODIUM-DEXTROSE 2-4 GM/100ML-% IV SOLN: 2.0000 g | INTRAVENOUS | Status: DC

## 2020-10-14 MED ORDER — FENTANYL CITRATE (PF) 100 MCG/2ML IJ SOLN
INTRAMUSCULAR | Status: AC
  Filled 2020-10-14: qty 2

## 2020-10-14 MED ORDER — LIDOCAINE 2% (20 MG/ML) 5 ML SYRINGE
INTRAMUSCULAR | Status: AC
  Filled 2020-10-14: qty 25

## 2020-10-14 MED ORDER — ONDANSETRON HCL 4 MG/2ML IJ SOLN
INTRAMUSCULAR | Status: AC
  Filled 2020-10-14: qty 12

## 2020-10-14 MED ORDER — EPHEDRINE 5 MG/ML INJ
INTRAVENOUS | Status: AC
  Filled 2020-10-14: qty 10

## 2020-10-14 SURGICAL SUPPLY — 32 items
BENZOIN TINCTURE PRP APPL 2/3 (GAUZE/BANDAGES/DRESSINGS) IMPLANT
BLADE SURG 15 STRL LF DISP TIS (BLADE) ×2 IMPLANT
BLADE SURG 15 STRL SS (BLADE) ×3
CHLORAPREP W/TINT 26 (MISCELLANEOUS) ×3 IMPLANT
COVER BACK TABLE 60X90IN (DRAPES) ×3 IMPLANT
COVER MAYO STAND STRL (DRAPES) ×3 IMPLANT
COVER WAND RF STERILE (DRAPES) IMPLANT
DECANTER SPIKE VIAL GLASS SM (MISCELLANEOUS) ×3 IMPLANT
DERMABOND ADVANCED (GAUZE/BANDAGES/DRESSINGS) ×1
DERMABOND ADVANCED .7 DNX12 (GAUZE/BANDAGES/DRESSINGS) ×2 IMPLANT
DRAPE LAPAROTOMY 100X72 PEDS (DRAPES) ×3 IMPLANT
DRAPE UTILITY XL STRL (DRAPES) ×3 IMPLANT
ELECT REM PT RETURN 9FT ADLT (ELECTROSURGICAL) ×3
ELECTRODE REM PT RTRN 9FT ADLT (ELECTROSURGICAL) ×2 IMPLANT
GLOVE ECLIPSE 8.0 STRL XLNG CF (GLOVE) ×3 IMPLANT
GLOVE SRG 8 PF TXTR STRL LF DI (GLOVE) ×2 IMPLANT
GLOVE SURG UNDER POLY LF SZ8 (GLOVE) ×3
GOWN STRL REUS W/ TWL LRG LVL3 (GOWN DISPOSABLE) ×2 IMPLANT
GOWN STRL REUS W/ TWL XL LVL3 (GOWN DISPOSABLE) ×2 IMPLANT
GOWN STRL REUS W/TWL LRG LVL3 (GOWN DISPOSABLE) ×3
GOWN STRL REUS W/TWL XL LVL3 (GOWN DISPOSABLE) ×3
NEEDLE HYPO 25X1 1.5 SAFETY (NEEDLE) ×3 IMPLANT
NS IRRIG 1000ML POUR BTL (IV SOLUTION) IMPLANT
PACK BASIN DAY SURGERY FS (CUSTOM PROCEDURE TRAY) ×3 IMPLANT
PENCIL SMOKE EVACUATOR (MISCELLANEOUS) IMPLANT
SLEEVE SCD COMPRESS KNEE MED (MISCELLANEOUS) IMPLANT
SPONGE LAP 4X18 RFD (DISPOSABLE) ×3 IMPLANT
STRIP CLOSURE SKIN 1/2X4 (GAUZE/BANDAGES/DRESSINGS) IMPLANT
SUT MON AB 4-0 PC3 18 (SUTURE) ×3 IMPLANT
SUT VICRYL 3-0 CR8 SH (SUTURE) ×3 IMPLANT
SYR CONTROL 10ML LL (SYRINGE) ×3 IMPLANT
TOWEL GREEN STERILE FF (TOWEL DISPOSABLE) ×3 IMPLANT

## 2020-10-14 NOTE — Op Note (Signed)

## 2020-10-14 NOTE — Interval H&P Note (Signed)
History and Physical Interval Note:  10/14/2020 12:55 PM  Michelle [name]  has presented today for surgery, with the diagnosis of port in place.  The various methods of treatment have been discussed with the patient and family. After consideration of risks, benefits and other options for treatment, the patient has consented to  Procedure(s): REMOVAL PORT-A-CATH (N/A) as a surgical intervention.  The patient's history has been reviewed, patient examined, no change in status, stable for surgery.  I have reviewed the patient's chart and labs.  Questions were answered to the patient's satisfaction.     [name] A Larnce Schnackenberg

## 2020-10-14 NOTE — Discharge Instructions (Signed)
GENERAL SURGERY: POST OP INSTRUCTIONS  ######################################################################  EAT Gradually transition to a high fiber diet with a fiber supplement over the next few weeks after discharge.  Start with a pureed / full liquid diet (see below)  WALK Walk an hour a day.  Control your pain to do that.    CONTROL PAIN Control pain so that you can walk, sleep, tolerate sneezing/coughing, go up/down stairs.  HAVE A BOWEL MOVEMENT DAILY Keep your bowels regular to avoid problems.  OK to try a laxative to override constipation.  OK to use an antidairrheal to slow down diarrhea.  Call if not better after 2 tries  CALL IF YOU HAVE PROBLEMS/CONCERNS Call if you are still struggling despite following these instructions. Call if you have concerns not answered by these instructions  ######################################################################    1. DIET: Follow a light bland diet & liquids the first 24 hours after arrival home, such as soup, liquids, starches, etc.  Be sure to drink plenty of fluids.  Quickly advance to a usual solid diet within a few days.  Avoid fast food or heavy meals as your are more likely to get nauseated or have irregular bowels.  A low-fat, high-fiber diet for the rest of your life is ideal.   2. Take your usually prescribed home medications unless otherwise directed. 3. PAIN CONTROL: a. Pain is best controlled by a usual combination of three different methods TOGETHER: i. Ice/Heat ii. Over the counter pain medication iii. Prescription pain medication b. Most patients will experience some swelling and bruising around the incisions.  Ice packs or heating pads (30-60 minutes up to 6 times a day) will help. Use ice for the first few days to help decrease swelling and bruising, then switch to heat to help relax tight/sore spots and speed recovery.  Some people prefer to use ice alone, heat alone, alternating between ice & heat.   Experiment to what works for you.  Swelling and bruising can take several weeks to resolve.   c. It is helpful to take an over-the-counter pain medication regularly for the first few weeks.  Choose one of the following that works best for you: i. Naproxen (Aleve, etc)  Two 220mg tabs twice a day ii. Ibuprofen (Advil, etc) Three 200mg tabs four times a day (every meal & bedtime) iii. Acetaminophen (Tylenol, etc) 500-650mg four times a day (every meal & bedtime) d. A  prescription for pain medication (such as oxycodone, hydrocodone, etc) should be given to you upon discharge.  Take your pain medication as prescribed.  i. If you are having problems/concerns with the prescription medicine (does not control pain, nausea, vomiting, rash, itching, etc), please call us [phone] to see if we need to switch you to a different pain medicine that will work better for you and/or control your side effect better. ii. If you need a refill on your pain medication, please contact your pharmacy.  They will contact our office to request authorization. Prescriptions will not be filled after 5 pm or on week-ends. 4. Avoid getting constipated.  Between the surgery and the pain medications, it is common to experience some constipation.  Increasing fluid intake and taking a fiber supplement (such as Metamucil, Citrucel, FiberCon, MiraLax, etc) 1-2 times a day regularly will usually help prevent this problem from occurring.  A mild laxative (prune juice, Milk of Magnesia, MiraLax, etc) should be taken according to package directions if there are no bowel movements after 48 hours.   5. Wash /   shower every day.  You may shower over the dressings as they are waterproof.  Continue to shower over incision(s) after the dressing is off. 6. Remove your waterproof bandages 5 days after surgery.  You may leave the incision open to air.  You may have skin tapes (Steri Strips) covering the incision(s).  Leave them on until one week, then  remove.  You may replace a dressing/Band-Aid to cover the incision for comfort if you wish.      7. ACTIVITIES as tolerated:   a. You may resume regular (light) daily activities beginning the next day--such as daily self-care, walking, climbing stairs--gradually increasing activities as tolerated.  If you can walk 30 minutes without difficulty, it is safe to try more intense activity such as jogging, treadmill, bicycling, low-impact aerobics, swimming, etc. b. Save the most intensive and strenuous activity for last such as sit-ups, heavy lifting, contact sports, etc  Refrain from any heavy lifting or straining until you are off narcotics for pain control.   c. DO NOT PUSH THROUGH PAIN.  Let pain be your guide: If it hurts to do something, don't do it.  Pain is your body warning you to avoid that activity for another week until the pain goes down. d. You may drive when you are no longer taking prescription pain medication, you can comfortably wear a seatbelt, and you can safely maneuver your car and apply brakes. e. You may have sexual intercourse when it is comfortable.  8. FOLLOW UP in our office a. Please call [facility] at [phone] to set up an appointment to see your surgeon in the office for a follow-up appointment approximately 2-3 weeks after your surgery. b. Make sure that you call for this appointment the day you arrive home to insure a convenient appointment time. 9. IF YOU HAVE DISABILITY OR FAMILY LEAVE FORMS, BRING THEM TO THE OFFICE FOR PROCESSING.  DO NOT GIVE THEM TO YOUR DOCTOR.   WHEN TO CALL US [phone]: 1. Poor pain control 2. Reactions / problems with new medications (rash/itching, nausea, etc)  3. Fever over 101.5 F (38.5 C) 4. Worsening swelling or bruising 5. Continued bleeding from incision. 6. Increased pain, redness, or drainage from the incision 7. Difficulty breathing / swallowing   The clinic staff is available to answer your questions during regular  business hours (8:30am-5pm).  Please don't hesitate to call and ask to speak to one of our nurses for clinical concerns.   If you have a medical emergency, go to the nearest emergency room or call 911.  A surgeon from [facility] is always on call at the hospitals   [facility] [address], [address], [place], [place]  [address] ? MAIN: [phone] ? TOLL FREE: [phone] ?  FAX [phone] [email or web address]  

## 2020-10-15 ENCOUNTER — Encounter (HOSPITAL_BASED_OUTPATIENT_CLINIC_OR_DEPARTMENT_OTHER): Payer: Self-pay | Admitting: Surgery

## 2020-10-20 ENCOUNTER — Other Ambulatory Visit: Payer: Self-pay | Admitting: *Deleted

## 2020-10-20 NOTE — Progress Notes (Signed)
This RN attempted to return call to pt per her VM stating "when is my next appt".  No answer per cell number and home number no longer in service.
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
## 2021-02-02 ENCOUNTER — Inpatient Hospital Stay: Payer: Medicare Other | Attending: Oncology | Admitting: Oncology

## 2021-02-02 ENCOUNTER — Inpatient Hospital Stay: Payer: Medicare Other

## 2021-02-02 DIAGNOSIS — Z170 Estrogen receptor positive status [ER+]: Secondary | ICD-10-CM

## 2021-02-02 DIAGNOSIS — C50212 Malignant neoplasm of upper-inner quadrant of left female breast: Secondary | ICD-10-CM

## 2021-02-03 ENCOUNTER — Encounter: Payer: Self-pay | Admitting: Oncology

## 2021-02-24 ENCOUNTER — Other Ambulatory Visit: Payer: Self-pay | Admitting: Oncology

## 2021-03-10 ENCOUNTER — Encounter: Payer: Self-pay | Admitting: Oncology

## 2021-04-17 ENCOUNTER — Encounter (HOSPITAL_COMMUNITY): Payer: Self-pay

## 2022-06-15 ENCOUNTER — Non-Acute Institutional Stay: Payer: Medicare Other | Admitting: Adult Health

## 2022-06-15 DIAGNOSIS — I10 Essential (primary) hypertension: Secondary | ICD-10-CM | POA: Diagnosis not present

## 2022-06-15 DIAGNOSIS — C50212 Malignant neoplasm of upper-inner quadrant of left female breast: Secondary | ICD-10-CM | POA: Diagnosis not present

## 2022-06-15 DIAGNOSIS — E039 Hypothyroidism, unspecified: Secondary | ICD-10-CM | POA: Diagnosis not present

## 2022-06-15 DIAGNOSIS — R42 Dizziness and giddiness: Secondary | ICD-10-CM

## 2022-06-15 DIAGNOSIS — K219 Gastro-esophageal reflux disease without esophagitis: Secondary | ICD-10-CM | POA: Diagnosis not present

## 2022-06-15 DIAGNOSIS — F419 Anxiety disorder, unspecified: Secondary | ICD-10-CM

## 2022-06-15 DIAGNOSIS — Z170 Estrogen receptor positive status [ER+]: Secondary | ICD-10-CM

## 2022-06-16 ENCOUNTER — Encounter: Payer: Self-pay | Admitting: Adult Health
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
## 2022-06-17 ENCOUNTER — Non-Acute Institutional Stay: Payer: Medicare Other | Admitting: Nurse Practitioner

## 2022-06-17 ENCOUNTER — Encounter: Payer: Self-pay | Admitting: Nurse Practitioner

## 2022-06-17 DIAGNOSIS — C50212 Malignant neoplasm of upper-inner quadrant of left female breast: Secondary | ICD-10-CM

## 2022-06-17 DIAGNOSIS — R42 Dizziness and giddiness: Secondary | ICD-10-CM

## 2022-06-17 DIAGNOSIS — F09 Unspecified mental disorder due to known physiological condition: Secondary | ICD-10-CM | POA: Diagnosis not present

## 2022-06-17 DIAGNOSIS — Z170 Estrogen receptor positive status [ER+]: Secondary | ICD-10-CM

## 2022-06-17 DIAGNOSIS — R269 Unspecified abnormalities of gait and mobility: Secondary | ICD-10-CM | POA: Diagnosis not present

## 2022-06-17 DIAGNOSIS — K219 Gastro-esophageal reflux disease without esophagitis: Secondary | ICD-10-CM | POA: Insufficient documentation

## 2022-06-17 DIAGNOSIS — I10 Essential (primary) hypertension: Secondary | ICD-10-CM | POA: Insufficient documentation

## 2022-06-17 DIAGNOSIS — F419 Anxiety disorder, unspecified: Secondary | ICD-10-CM | POA: Insufficient documentation

## 2022-06-17 DIAGNOSIS — K5901 Slow transit constipation: Secondary | ICD-10-CM

## 2022-06-17 DIAGNOSIS — E039 Hypothyroidism, unspecified: Secondary | ICD-10-CM | POA: Insufficient documentation

## 2022-06-17 LAB — COMPREHENSIVE METABOLIC PANEL
Albumin: 3.7 (ref 3.5–5.0)
Calcium: 9 (ref 8.7–10.7)
Globulin: 2.2
eGFR: 64

## 2022-06-17 LAB — CBC AND DIFFERENTIAL
HCT: 41 (ref 36–46)
Hemoglobin: 14.1 (ref 12.0–16.0)
Platelets: 136 10*3/uL — AB (ref 150–400)
WBC: 4.1

## 2022-06-17 LAB — HEPATIC FUNCTION PANEL
ALT: 15 U/L (ref 7–35)
AST: 20 (ref 13–35)
Alkaline Phosphatase: 60 (ref 25–125)
Bilirubin, Total: 0.7

## 2022-06-17 LAB — BASIC METABOLIC PANEL
BUN: 12 (ref 4–21)
CO2: 30 — AB (ref 13–22)
Chloride: 103 (ref 99–108)
Creatinine: 0.9 (ref 0.5–1.1)
Glucose: 81
Potassium: 3.9 mEq/L (ref 3.5–5.1)
Sodium: 141 (ref 137–147)

## 2022-06-17 LAB — LIPID PANEL
Cholesterol: 225 — AB (ref 0–200)
HDL: 43 (ref 35–70)
LDL Cholesterol: 153
LDl/HDL Ratio: 5.2
Triglycerides: 156 (ref 40–160)

## 2022-06-17 LAB — CBC: RBC: 4.56 (ref 3.87–5.11)

## 2022-06-17 LAB — HEMOGLOBIN A1C: Hemoglobin A1C: 5.5

## 2022-06-17 LAB — TSH: TSH: 3.94 (ref 0.41–5.90)

## 2022-06-17 NOTE — Assessment & Plan Note (Signed)
Stable, , taking MiraLax
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
## 2022-06-17 NOTE — Assessment & Plan Note (Signed)
Elevated Bp, increase Losartan to '50mg'$  qd, Bp daily

## 2022-06-17 NOTE — Assessment & Plan Note (Signed)
Continue prn Alprazolam

## 2022-06-17 NOTE — Assessment & Plan Note (Signed)
Continue Levothyroxine. TSH

## 2022-06-17 NOTE — Assessment & Plan Note (Signed)
S/p L breast cancer lumpectomy/radioactive seed, takes Arimidex, f/u oncology 
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
## 2022-06-17 NOTE — Assessment & Plan Note (Signed)
Stable, continue Esomeprazole, Hgb 14.1 06/16/22 

## 2022-06-17 NOTE — Assessment & Plan Note (Signed)
Mild Cognitive impairment, [place]. Obtain MMSE 06/17/22 MMSE 28/30

## 2022-06-17 NOTE — Assessment & Plan Note (Signed)
r vertigo about 2-3 weeks, no focal neurological symptoms, Meclizine helps partially. Will obtain CT head if HPOA consents.

## 2022-06-17 NOTE — Assessment & Plan Note (Signed)
Refer to therapy

## 2022-06-20 ENCOUNTER — Encounter: Payer: Self-pay | Admitting: Nurse Practitioner

## 2022-06-20 ENCOUNTER — Other Ambulatory Visit: Payer: Self-pay | Admitting: Family Medicine

## 2022-06-20 DIAGNOSIS — R42 Dizziness and giddiness: Secondary | ICD-10-CM

## 2022-06-21 ENCOUNTER — Non-Acute Institutional Stay: Payer: Medicare Other | Admitting: Family Medicine

## 2022-06-21 ENCOUNTER — Encounter: Payer: Self-pay | Admitting: Family Medicine

## 2022-06-21 DIAGNOSIS — I10 Essential (primary) hypertension: Secondary | ICD-10-CM

## 2022-06-21 DIAGNOSIS — Z170 Estrogen receptor positive status [ER+]: Secondary | ICD-10-CM

## 2022-06-21 DIAGNOSIS — E039 Hypothyroidism, unspecified: Secondary | ICD-10-CM

## 2022-06-21 DIAGNOSIS — C50212 Malignant neoplasm of upper-inner quadrant of left female breast: Secondary | ICD-10-CM | POA: Diagnosis not present

## 2022-06-21 DIAGNOSIS — F419 Anxiety disorder, unspecified: Secondary | ICD-10-CM | POA: Diagnosis not present

## 2022-06-21 DIAGNOSIS — R269 Unspecified abnormalities of gait and mobility: Secondary | ICD-10-CM

## 2022-06-21 DIAGNOSIS — F09 Unspecified mental disorder due to known physiological condition: Secondary | ICD-10-CM
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
## 2022-06-30 ENCOUNTER — Ambulatory Visit
Admission: RE | Admit: 2022-06-30 | Discharge: 2022-06-30 | Disposition: A | Discharge status: Home / Self Care | Payer: Medicare Other | Source: Ambulatory Visit | Attending: Family Medicine | Admitting: Family Medicine

## 2022-06-30 DIAGNOSIS — R42 Dizziness and giddiness: Secondary | ICD-10-CM

## 2022-07-15 ENCOUNTER — Encounter: Payer: Self-pay | Admitting: Nurse Practitioner

## 2022-07-15 NOTE — Progress Notes (Signed)
This encounter was created in error - please disregard.

## 2022-08-09 ENCOUNTER — Non-Acute Institutional Stay: Payer: Medicare Other | Admitting: Nurse Practitioner

## 2022-08-09 ENCOUNTER — Encounter: Payer: Self-pay | Admitting: Nurse Practitioner

## 2022-08-09 DIAGNOSIS — F419 Anxiety disorder, unspecified: Secondary | ICD-10-CM | POA: Diagnosis not present

## 2022-08-09 DIAGNOSIS — K5901 Slow transit constipation: Secondary | ICD-10-CM

## 2022-08-09 DIAGNOSIS — S0003XA Contusion of scalp, initial encounter: Secondary | ICD-10-CM

## 2022-08-09 DIAGNOSIS — R42 Dizziness and giddiness: Secondary | ICD-10-CM | POA: Diagnosis not present

## 2022-08-09 DIAGNOSIS — C50212 Malignant neoplasm of upper-inner quadrant of left female breast: Secondary | ICD-10-CM

## 2022-08-09 DIAGNOSIS — I10 Essential (primary) hypertension: Secondary | ICD-10-CM

## 2022-08-09 DIAGNOSIS — K219 Gastro-esophageal reflux disease without esophagitis: Secondary | ICD-10-CM

## 2022-08-09 DIAGNOSIS — R269 Unspecified abnormalities of gait and mobility: Secondary | ICD-10-CM

## 2022-08-09 DIAGNOSIS — Z170 Estrogen receptor positive status [ER+]: Secondary | ICD-10-CM

## 2022-08-09 DIAGNOSIS — F09 Unspecified mental disorder due to known physiological condition: Secondary | ICD-10-CM

## 2022-08-09 DIAGNOSIS — E039 Hypothyroidism, unspecified: Secondary | ICD-10-CM
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
## 2022-08-09 NOTE — Assessment & Plan Note (Signed)
S/p L breast cancer lumpectomy/radioactive seed, takes Arimidex, f/u oncology
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
## 2022-08-09 NOTE — Assessment & Plan Note (Signed)
06/30/22 CT head, No acute intracranial abnormality. Age-related atrophy and chronic microvascular ischemic change. Prn Meclizine available to her.

## 2022-08-09 NOTE — Assessment & Plan Note (Signed)
Mild Cognitive impairment, resides in AL for supportive care, started Donepezil 07/14/22 by Dr. Theda Sers.

## 2022-08-09 NOTE — Assessment & Plan Note (Signed)
Stable,  Esomeprazole, Hgb 14.1 06/16/22

## 2022-08-09 NOTE — Assessment & Plan Note (Signed)
Stable, taking MiraLax

## 2022-08-09 NOTE — Assessment & Plan Note (Signed)
Gait abnormality, risk for falling

## 2022-08-09 NOTE — Assessment & Plan Note (Signed)
Blood pressure is controlled, takes Losartan, ASA, Bun/creat12/0.87 06/16/22

## 2022-08-09 NOTE — Assessment & Plan Note (Signed)
Mild, continue prn Alprazolam

## 2022-08-09 NOTE — Assessment & Plan Note (Signed)
Stable, takes Levothyroxine, TSH 3.94 06/17/22

## 2022-08-12 ENCOUNTER — Encounter: Payer: Self-pay | Admitting: Nurse Practitioner

## 2022-09-22 ENCOUNTER — Encounter: Payer: Self-pay | Admitting: Nurse Practitioner

## 2022-09-22 ENCOUNTER — Non-Acute Institutional Stay: Payer: Medicare Other | Admitting: Nurse Practitioner

## 2022-09-22 DIAGNOSIS — I10 Essential (primary) hypertension: Secondary | ICD-10-CM | POA: Diagnosis not present

## 2022-09-22 DIAGNOSIS — K219 Gastro-esophageal reflux disease without esophagitis: Secondary | ICD-10-CM

## 2022-09-22 DIAGNOSIS — C50212 Malignant neoplasm of upper-inner quadrant of left female breast: Secondary | ICD-10-CM

## 2022-09-22 DIAGNOSIS — F419 Anxiety disorder, unspecified: Secondary | ICD-10-CM

## 2022-09-22 DIAGNOSIS — E039 Hypothyroidism, unspecified: Secondary | ICD-10-CM

## 2022-09-22 DIAGNOSIS — R42 Dizziness and giddiness: Secondary | ICD-10-CM

## 2022-09-22 DIAGNOSIS — Z170 Estrogen receptor positive status [ER+]: Secondary | ICD-10-CM

## 2022-09-22 DIAGNOSIS — F09 Unspecified mental disorder due to known physiological condition: Secondary | ICD-10-CM

## 2022-09-22 DIAGNOSIS — L989 Disorder of the skin and subcutaneous tissue, unspecified: Secondary | ICD-10-CM | POA: Diagnosis not present

## 2022-09-22 DIAGNOSIS — L089 Local infection of the skin and subcutaneous tissue, unspecified: Secondary | ICD-10-CM | POA: Insufficient documentation

## 2022-09-22 DIAGNOSIS — K5901 Slow transit constipation: Secondary | ICD-10-CM

## 2022-09-22 NOTE — Assessment & Plan Note (Signed)
S/p L breast cancer lumpectomy/radioactive seed, takes Arimidex, f/u oncology

## 2022-09-22 NOTE — Assessment & Plan Note (Signed)
Stable, taking MiraLax

## 2022-09-22 NOTE — Assessment & Plan Note (Addendum)
Noted a skin lesion left upper arm, about a marble sized, pearly appearance, center umbilical ulcerated presentation, denied pain, slightly warmth, no apparent swelling. Dermatology evaluation ASAP.
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
## 2022-09-22 NOTE — Assessment & Plan Note (Signed)
06/30/22 CT head, No acute intracranial abnormality. Age-related atrophy and chronic microvascular ischemic change. Prn Meclizine available to her.

## 2022-09-22 NOTE — Assessment & Plan Note (Signed)
takes Levothyroxine, TSH 3.94 06/17/22

## 2022-09-22 NOTE — Assessment & Plan Note (Signed)
Elevated Sbp 180, asymptomatic, will monitor Bp daily, adding prn Clonidine 0.'1mg'$  q8hr if Bp>180/90, takes Losartan, ASA, Bun/creat12/0.87 06/16/22

## 2022-09-22 NOTE — Assessment & Plan Note (Signed)
resides in AL for supportive care,  started Donepezil 07/14/22 by Dr. Theda Sers, tolerated well.

## 2022-09-22 NOTE — Assessment & Plan Note (Signed)
Stable, continue Esomeprazole, Hgb 14.1 06/16/22

## 2022-10-14 ENCOUNTER — Encounter: Payer: Self-pay | Admitting: Nurse Practitioner

## 2022-10-14 ENCOUNTER — Non-Acute Institutional Stay: Payer: Medicare Other | Admitting: Nurse Practitioner

## 2022-10-14 DIAGNOSIS — R42 Dizziness and giddiness: Secondary | ICD-10-CM

## 2022-10-14 DIAGNOSIS — Z170 Estrogen receptor positive status [ER+]: Secondary | ICD-10-CM

## 2022-10-14 DIAGNOSIS — F09 Unspecified mental disorder due to known physiological condition: Secondary | ICD-10-CM

## 2022-10-14 DIAGNOSIS — I10 Essential (primary) hypertension: Secondary | ICD-10-CM

## 2022-10-14 DIAGNOSIS — R269 Unspecified abnormalities of gait and mobility: Secondary | ICD-10-CM

## 2022-10-14 DIAGNOSIS — L089 Local infection of the skin and subcutaneous tissue, unspecified: Secondary | ICD-10-CM | POA: Diagnosis not present

## 2022-10-14 DIAGNOSIS — C50212 Malignant neoplasm of upper-inner quadrant of left female breast: Secondary | ICD-10-CM

## 2022-10-14 DIAGNOSIS — F419 Anxiety disorder, unspecified: Secondary | ICD-10-CM | POA: Diagnosis not present

## 2022-10-14 DIAGNOSIS — K219 Gastro-esophageal reflux disease without esophagitis: Secondary | ICD-10-CM

## 2022-10-14 DIAGNOSIS — E039 Hypothyroidism, unspecified: Secondary | ICD-10-CM

## 2022-10-14 NOTE — Assessment & Plan Note (Signed)
S/p L breast cancer lumpectomy/radioactive seed, takes Arimidex, f/u oncology

## 2022-10-14 NOTE — Assessment & Plan Note (Signed)
Stable, Esomeprazole, Hgb 14.1 06/16/22

## 2022-10-14 NOTE — Assessment & Plan Note (Signed)
takes Levothyroxine, TSH 3.94 06/17/22

## 2022-10-14 NOTE — Assessment & Plan Note (Signed)
06/30/22 CT head, No acute intracranial abnormality. Age-related atrophy and chronic microvascular ischemic change. Prn Meclizine available to her.
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
## 2022-10-14 NOTE — Assessment & Plan Note (Signed)
Blood pressure is controlled, takes Losartan, ASA, Bun/creat12/0.87 06/16/22

## 2022-10-14 NOTE — Assessment & Plan Note (Signed)
resides in AL for supportive care,  MMSE 28/30 06/17/22, started Donepezil 07/14/22 by Dr. Theda Sers

## 2022-10-14 NOTE — Assessment & Plan Note (Signed)
furniture walking sometimes.

## 2022-10-14 NOTE — Assessment & Plan Note (Signed)
Prn Alprazolam available to her.

## 2022-10-14 NOTE — Assessment & Plan Note (Signed)
erythema surrounding left upper arm wound-biopsy site 09/22/22, no active bleeding, odorous drainage, mild swelling and warmth present. Will apply Bactroban oint bid to affected area x10 days.

## 2022-10-17 ENCOUNTER — Encounter: Payer: Self-pay | Admitting: Nurse Practitioner

## 2022-12-28 ENCOUNTER — Non-Acute Institutional Stay: Payer: Medicare Other | Admitting: Family Medicine

## 2022-12-28 DIAGNOSIS — R269 Unspecified abnormalities of gait and mobility: Secondary | ICD-10-CM

## 2022-12-28 DIAGNOSIS — K219 Gastro-esophageal reflux disease without esophagitis: Secondary | ICD-10-CM

## 2022-12-28 DIAGNOSIS — I10 Essential (primary) hypertension: Secondary | ICD-10-CM | POA: Diagnosis not present

## 2022-12-28 DIAGNOSIS — E039 Hypothyroidism, unspecified: Secondary | ICD-10-CM | POA: Diagnosis not present
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
## 2023-02-06 ENCOUNTER — Non-Acute Institutional Stay: Payer: Medicare Other | Admitting: Adult Health

## 2023-02-06 ENCOUNTER — Encounter: Payer: Self-pay | Admitting: Adult Health

## 2023-02-06 DIAGNOSIS — M21619 Bunion of unspecified foot: Secondary | ICD-10-CM
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
## 2023-02-17 ENCOUNTER — Non-Acute Institutional Stay: Payer: Medicare Other | Admitting: Nurse Practitioner

## 2023-02-17 ENCOUNTER — Encounter: Payer: Self-pay | Admitting: Nurse Practitioner

## 2023-02-17 DIAGNOSIS — K219 Gastro-esophageal reflux disease without esophagitis: Secondary | ICD-10-CM | POA: Diagnosis not present

## 2023-02-17 DIAGNOSIS — R269 Unspecified abnormalities of gait and mobility: Secondary | ICD-10-CM

## 2023-02-17 DIAGNOSIS — C50212 Malignant neoplasm of upper-inner quadrant of left female breast: Secondary | ICD-10-CM | POA: Diagnosis not present

## 2023-02-17 DIAGNOSIS — F09 Unspecified mental disorder due to known physiological condition: Secondary | ICD-10-CM

## 2023-02-17 DIAGNOSIS — K5901 Slow transit constipation: Secondary | ICD-10-CM

## 2023-02-17 DIAGNOSIS — E039 Hypothyroidism, unspecified: Secondary | ICD-10-CM

## 2023-02-17 DIAGNOSIS — I10 Essential (primary) hypertension: Secondary | ICD-10-CM

## 2023-02-17 DIAGNOSIS — F419 Anxiety disorder, unspecified: Secondary | ICD-10-CM

## 2023-02-17 DIAGNOSIS — Z170 Estrogen receptor positive status [ER+]: Secondary | ICD-10-CM

## 2023-02-17 NOTE — Assessment & Plan Note (Signed)
Stable, taking MiraLax 
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
## 2023-02-17 NOTE — Assessment & Plan Note (Signed)
furniture walking sometimes.  

## 2023-02-17 NOTE — Progress Notes (Deleted)
.  psc 

## 2023-02-17 NOTE — Assessment & Plan Note (Signed)
intermittent elevated Bp, takes Losartan, Clonidine prn, ASA, Bun/creat12/0.87 06/16/22

## 2023-02-17 NOTE — Assessment & Plan Note (Signed)
Stable,  Esomeprazole, Hgb 14.1 06/16/22 

## 2023-02-17 NOTE — Assessment & Plan Note (Signed)
takes Levothyroxine, TSH 3.94 06/17/22 

## 2023-02-17 NOTE — Assessment & Plan Note (Signed)
Prn Alprazolam available to her. 

## 2023-02-17 NOTE — Assessment & Plan Note (Signed)
resides in AL for supportive care,  started Donepezil 07/14/22 by Dr. Thomasena Edis. Desires full code, MMSE 28/30 06/17/22

## 2023-03-14 ENCOUNTER — Encounter: Payer: Self-pay | Admitting: Nurse Practitioner

## 2023-03-16 ENCOUNTER — Encounter: Payer: Self-pay | Admitting: Nurse Practitioner

## 2023-03-16 ENCOUNTER — Non-Acute Institutional Stay (INDEPENDENT_AMBULATORY_CARE_PROVIDER_SITE_OTHER): Payer: Medicare Other | Admitting: Nurse Practitioner

## 2023-03-16 DIAGNOSIS — Z0000 Encounter for general adult medical examination without abnormal findings: Secondary | ICD-10-CM | POA: Diagnosis not present

## 2023-03-16 NOTE — Progress Notes (Signed)
This encounter was created in error - please disregard.

## 2023-03-17 ENCOUNTER — Encounter: Payer: Self-pay | Admitting: Nurse Practitioner
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
## 2023-05-19 ENCOUNTER — Encounter: Payer: Self-pay | Admitting: Family Medicine

## 2023-05-22 ENCOUNTER — Encounter: Payer: Self-pay | Admitting: Family Medicine

## 2023-06-02 ENCOUNTER — Non-Acute Institutional Stay: Payer: Medicare Other | Admitting: Sports Medicine

## 2023-06-02 DIAGNOSIS — F09 Unspecified mental disorder due to known physiological condition: Secondary | ICD-10-CM | POA: Diagnosis not present

## 2023-06-02 DIAGNOSIS — K219 Gastro-esophageal reflux disease without esophagitis: Secondary | ICD-10-CM

## 2023-06-02 DIAGNOSIS — C50212 Malignant neoplasm of upper-inner quadrant of left female breast: Secondary | ICD-10-CM

## 2023-06-02 DIAGNOSIS — Z170 Estrogen receptor positive status [ER+]: Secondary | ICD-10-CM

## 2023-06-02 DIAGNOSIS — E039 Hypothyroidism, unspecified: Secondary | ICD-10-CM | POA: Diagnosis not present

## 2023-06-02 DIAGNOSIS — I10 Essential (primary) hypertension: Secondary | ICD-10-CM

## 2023-06-02 DIAGNOSIS — F419 Anxiety disorder, unspecified: Secondary | ICD-10-CM
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
## 2023-06-06 LAB — COMPREHENSIVE METABOLIC PANEL
Albumin: 3.4 — AB (ref 3.5–5.0)
Calcium: 8.6 — AB (ref 8.7–10.7)
Globulin: 2.2

## 2023-06-06 LAB — CBC AND DIFFERENTIAL
HCT: 39 (ref 36–46)
Hemoglobin: 12.9 (ref 12.0–16.0)
Neutrophils Absolute: 2273
Platelets: 141 10*3/uL — AB (ref 150–400)
WBC: 4.5

## 2023-06-06 LAB — VITAMIN D 25 HYDROXY (VIT D DEFICIENCY, FRACTURES): Vit D, 25-Hydroxy: 34

## 2023-06-06 LAB — HEPATIC FUNCTION PANEL
ALT: 15 U/L (ref 7–35)
AST: 18 (ref 13–35)
Alkaline Phosphatase: 55 (ref 25–125)
Bilirubin, Total: 0.4

## 2023-06-06 LAB — BASIC METABOLIC PANEL
BUN: 15 (ref 4–21)
CO2: 29 — AB (ref 13–22)
Chloride: 103 (ref 99–108)
Creatinine: 0.8 (ref 0.5–1.1)
Glucose: 97
Potassium: 4.1 mEq/L (ref 3.5–5.1)
Sodium: 140 (ref 137–147)

## 2023-06-06 LAB — TSH: TSH: 3.72 (ref 0.41–5.90)

## 2023-06-06 LAB — CBC: RBC: 4.23 (ref 3.87–5.11)

## 2023-06-07 ENCOUNTER — Encounter: Payer: Self-pay | Admitting: Sports Medicine

## 2023-06-08 ENCOUNTER — Encounter: Payer: Self-pay | Admitting: Nurse Practitioner

## 2023-06-08 DIAGNOSIS — E559 Vitamin D deficiency, unspecified: Secondary | ICD-10-CM | POA: Insufficient documentation

## 2023-06-13 LAB — TSH: TSH: 3.9 (ref 0.41–5.90)

## 2023-06-20 LAB — TSH: TSH: 4.64 (ref 0.41–5.90)

## 2023-06-21 ENCOUNTER — Non-Acute Institutional Stay: Payer: Medicare Other | Admitting: Adult Health

## 2023-06-21 ENCOUNTER — Encounter: Payer: Self-pay | Admitting: Adult Health

## 2023-06-21 DIAGNOSIS — E039 Hypothyroidism, unspecified: Secondary | ICD-10-CM

## 2023-06-21 MED ORDER — LEVOTHYROXINE SODIUM 88 MCG PO TABS: 88.0000 ug | ORAL_TABLET | Freq: Every day | ORAL | 3 refills | Status: AC
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
## 2023-06-29 ENCOUNTER — Non-Acute Institutional Stay: Payer: Medicare Other | Admitting: Nurse Practitioner

## 2023-06-29 ENCOUNTER — Encounter: Payer: Self-pay | Admitting: Nurse Practitioner

## 2023-06-29 DIAGNOSIS — K219 Gastro-esophageal reflux disease without esophagitis: Secondary | ICD-10-CM

## 2023-06-29 DIAGNOSIS — K5901 Slow transit constipation: Secondary | ICD-10-CM

## 2023-06-29 DIAGNOSIS — R269 Unspecified abnormalities of gait and mobility: Secondary | ICD-10-CM

## 2023-06-29 DIAGNOSIS — F419 Anxiety disorder, unspecified: Secondary | ICD-10-CM | POA: Diagnosis not present

## 2023-06-29 DIAGNOSIS — E039 Hypothyroidism, unspecified: Secondary | ICD-10-CM | POA: Diagnosis not present

## 2023-06-29 DIAGNOSIS — R42 Dizziness and giddiness: Secondary | ICD-10-CM | POA: Diagnosis not present

## 2023-06-29 DIAGNOSIS — I10 Essential (primary) hypertension: Secondary | ICD-10-CM | POA: Diagnosis not present

## 2023-06-29 DIAGNOSIS — Z170 Estrogen receptor positive status [ER+]: Secondary | ICD-10-CM

## 2023-06-29 DIAGNOSIS — C50212 Malignant neoplasm of upper-inner quadrant of left female breast: Secondary | ICD-10-CM

## 2023-06-29 DIAGNOSIS — F09 Unspecified mental disorder due to known physiological condition: Secondary | ICD-10-CM

## 2023-06-29 DIAGNOSIS — R5383 Other fatigue: Secondary | ICD-10-CM

## 2023-06-29 NOTE — Assessment & Plan Note (Signed)
Esomeprazole, Hgb 12.9 06/06/23

## 2023-06-29 NOTE — Assessment & Plan Note (Signed)
reported the patient's anxious, paranoid, packing up, not recognized her belongs, someone in her room moving things around, more confused, ambulates with walker as usual. Prn Alprazolam available to her. Will update CBC/diff, CMP/eGFR, will try Lexapro 5mg  every day.

## 2023-06-29 NOTE — Assessment & Plan Note (Signed)
06/30/22 CT head, No acute intracranial abnormality. Age-related atrophy and chronic microvascular ischemic change. Prn Meclizine available to her.

## 2023-06-29 NOTE — Assessment & Plan Note (Signed)
takes Losartan, Clonidine prn, ASA, Bun/creat 15/0.82 06/06/23

## 2023-06-29 NOTE — Assessment & Plan Note (Signed)
S/p L breast cancer lumpectomy/radioactive seed, takes Arimidex, f/u oncology

## 2023-06-29 NOTE — Assessment & Plan Note (Signed)
In general, denied headache, sore throat, cough, SOB, chest pain, dysuria, nausea, vomiting. Will update labs, test for COVID.

## 2023-06-29 NOTE — Assessment & Plan Note (Signed)
furniture walking sometimes.

## 2023-06-29 NOTE — Assessment & Plan Note (Signed)
taking MiraLax

## 2023-06-29 NOTE — Assessment & Plan Note (Signed)
takes Levothyroxine, TSH 3.72 06/06/23

## 2023-06-29 NOTE — Assessment & Plan Note (Signed)
resides in AL for supportive care,  started Donepezil 07/14/22 by Dr. Thomasena Edis. Desires full code
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
## 2023-06-30 LAB — COMPREHENSIVE METABOLIC PANEL
Albumin: 3.5 (ref 3.5–5.0)
Calcium: 8.5 — AB (ref 8.7–10.7)
Globulin: 2.2
eGFR: 53

## 2023-06-30 LAB — CBC AND DIFFERENTIAL
HCT: 37 (ref 36–46)
Hemoglobin: 12.4 (ref 12.0–16.0)
Neutrophils Absolute: 2851
Platelets: 156 10*3/uL (ref 150–400)
WBC: 5.3

## 2023-06-30 LAB — BASIC METABOLIC PANEL
BUN: 16 (ref 4–21)
CO2: 25 — AB (ref 13–22)
Chloride: 108 (ref 99–108)
Creatinine: 0.9 (ref 0.5–1.1)
Glucose: 93
Potassium: 4.2 meq/L (ref 3.5–5.1)
Sodium: 141 (ref 137–147)

## 2023-06-30 LAB — HEPATIC FUNCTION PANEL
ALT: 16 U/L (ref 7–35)
AST: 20 (ref 13–35)
Alkaline Phosphatase: 60 (ref 25–125)
Bilirubin, Total: 0.3

## 2023-06-30 LAB — CBC: RBC: 4 (ref 3.87–5.11)

## 2023-08-03 LAB — TSH: TSH: 2.48 (ref 0.41–5.90)

## 2023-08-07 ENCOUNTER — Encounter: Payer: Self-pay | Admitting: Nurse Practitioner

## 2023-08-07 NOTE — Progress Notes (Signed)
This encounter was created in error - please disregard.

## 2023-08-14 ENCOUNTER — Non-Acute Institutional Stay: Payer: Medicare Other | Admitting: Nurse Practitioner

## 2023-08-14 ENCOUNTER — Encounter: Payer: Self-pay | Admitting: Nurse Practitioner

## 2023-08-14 DIAGNOSIS — I10 Essential (primary) hypertension: Secondary | ICD-10-CM | POA: Diagnosis not present

## 2023-08-14 DIAGNOSIS — R269 Unspecified abnormalities of gait and mobility: Secondary | ICD-10-CM | POA: Diagnosis not present

## 2023-08-14 DIAGNOSIS — F09 Unspecified mental disorder due to known physiological condition: Secondary | ICD-10-CM

## 2023-08-14 DIAGNOSIS — Z170 Estrogen receptor positive status [ER+]: Secondary | ICD-10-CM

## 2023-08-14 DIAGNOSIS — E039 Hypothyroidism, unspecified: Secondary | ICD-10-CM | POA: Diagnosis not present

## 2023-08-14 DIAGNOSIS — C50212 Malignant neoplasm of upper-inner quadrant of left female breast: Secondary | ICD-10-CM

## 2023-08-14 DIAGNOSIS — K5901 Slow transit constipation: Secondary | ICD-10-CM

## 2023-08-14 DIAGNOSIS — K219 Gastro-esophageal reflux disease without esophagitis: Secondary | ICD-10-CM

## 2023-08-14 DIAGNOSIS — F419 Anxiety disorder, unspecified: Secondary | ICD-10-CM

## 2023-08-14 NOTE — Assessment & Plan Note (Signed)
S/p L breast cancer lumpectomy/radioactive seed, takes Arimidex since 2021, f/u oncology

## 2023-08-14 NOTE — Assessment & Plan Note (Signed)
furniture walking sometimes.  
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
## 2023-08-14 NOTE — Assessment & Plan Note (Signed)
takes Levothyroxine, TSH 2.48 08/03/23

## 2023-08-14 NOTE — Assessment & Plan Note (Signed)
stable, taking Lexapro,  Prn Alprazolam available to her. TSH 2.48 08/03/23

## 2023-08-14 NOTE — Assessment & Plan Note (Signed)
Stable, Esomeprazole, Hgb 12.4 06/30/23

## 2023-08-14 NOTE — Assessment & Plan Note (Signed)
Blood pressure is controlled, takes Losartan, Clonidine prn, ASA, Bun/creat 16/0.9 06/30/23

## 2023-08-14 NOTE — Assessment & Plan Note (Signed)
resides in AL for supportive care,  started Donepezil 07/14/22 by Dr. Thomasena Edis. Desires full code

## 2023-08-14 NOTE — Assessment & Plan Note (Signed)
taking MiraLax

## 2023-09-26 ENCOUNTER — Non-Acute Institutional Stay: Payer: Medicare Other | Admitting: Nurse Practitioner

## 2023-09-26 ENCOUNTER — Encounter: Payer: Self-pay | Admitting: Nurse Practitioner

## 2023-09-26 DIAGNOSIS — I10 Essential (primary) hypertension: Secondary | ICD-10-CM | POA: Diagnosis not present

## 2023-09-26 DIAGNOSIS — F09 Unspecified mental disorder due to known physiological condition: Secondary | ICD-10-CM

## 2023-09-26 DIAGNOSIS — F419 Anxiety disorder, unspecified: Secondary | ICD-10-CM

## 2023-09-26 DIAGNOSIS — Z170 Estrogen receptor positive status [ER+]: Secondary | ICD-10-CM

## 2023-09-26 DIAGNOSIS — E039 Hypothyroidism, unspecified: Secondary | ICD-10-CM

## 2023-09-26 DIAGNOSIS — K2900 Acute gastritis without bleeding: Secondary | ICD-10-CM | POA: Insufficient documentation

## 2023-09-26 DIAGNOSIS — K5901 Slow transit constipation: Secondary | ICD-10-CM

## 2023-09-26 DIAGNOSIS — R269 Unspecified abnormalities of gait and mobility: Secondary | ICD-10-CM

## 2023-09-26 DIAGNOSIS — C50212 Malignant neoplasm of upper-inner quadrant of left female breast: Secondary | ICD-10-CM

## 2023-09-26 DIAGNOSIS — K219 Gastro-esophageal reflux disease without esophagitis: Secondary | ICD-10-CM

## 2023-09-26 NOTE — Assessment & Plan Note (Signed)
 Blood pressure is controlled, takes Losartan, Clonidine prn, ASA, Bun/creat 16/0.9 06/30/23

## 2023-09-26 NOTE — Assessment & Plan Note (Signed)
resides in AL for supportive care,  started Donepezil 07/14/22 by Dr. Thomasena Edis. Desires full code

## 2023-09-26 NOTE — Assessment & Plan Note (Signed)
Gait abnormality, furniture walking sometimes, high risk of falling 2/2 increased frailty and lack of safety awareness.
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
## 2023-09-26 NOTE — Assessment & Plan Note (Signed)
Hold MiraLax until diarrhea resolved.

## 2023-09-26 NOTE — Assessment & Plan Note (Signed)
 stable, taking Lexapro,  Prn Alprazolam available to her. TSH 2.48 08/03/23

## 2023-09-26 NOTE — Assessment & Plan Note (Signed)
 takes Levothyroxine, TSH 2.48 08/03/23

## 2023-09-26 NOTE — Assessment & Plan Note (Signed)
reported nausea, vomiting, diarrhea, feel when the patient was found sitting on the floor in her room this morning, no apparent injury, the patient has no recollection of the event. The patient denied GI symptoms, she is afebrile.  Will have Zofran 4mg  q6hr prn x 72hrs available to her, hold MIraLax until diarrhea resolved, VS every day x3 days.

## 2023-09-26 NOTE — Assessment & Plan Note (Signed)
 S/p L breast cancer lumpectomy/radioactive seed, takes Arimidex since 2021, f/u oncology

## 2023-09-26 NOTE — Assessment & Plan Note (Signed)
 Stable, Esomeprazole, Hgb 12.4 06/30/23

## 2023-12-07 ENCOUNTER — Non-Acute Institutional Stay: Payer: Self-pay | Admitting: Sports Medicine

## 2023-12-07 DIAGNOSIS — C50212 Malignant neoplasm of upper-inner quadrant of left female breast: Secondary | ICD-10-CM | POA: Diagnosis not present

## 2023-12-07 DIAGNOSIS — F0390 Unspecified dementia without behavioral disturbance: Secondary | ICD-10-CM | POA: Diagnosis not present

## 2023-12-07 DIAGNOSIS — K219 Gastro-esophageal reflux disease without esophagitis: Secondary | ICD-10-CM

## 2023-12-07 DIAGNOSIS — N6320 Unspecified lump in the left breast, unspecified quadrant: Secondary | ICD-10-CM | POA: Diagnosis not present

## 2023-12-07 DIAGNOSIS — E039 Hypothyroidism, unspecified: Secondary | ICD-10-CM

## 2023-12-07 DIAGNOSIS — I10 Essential (primary) hypertension: Secondary | ICD-10-CM | POA: Diagnosis not present

## 2023-12-07 DIAGNOSIS — Z170 Estrogen receptor positive status [ER+]: Secondary | ICD-10-CM
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
## 2023-12-11 ENCOUNTER — Encounter: Payer: Self-pay | Admitting: Sports Medicine

## 2024-02-16 ENCOUNTER — Non-Acute Institutional Stay: Payer: Self-pay | Admitting: Sports Medicine

## 2024-02-16 ENCOUNTER — Encounter: Payer: Self-pay | Admitting: Sports Medicine

## 2024-02-16 DIAGNOSIS — N390 Urinary tract infection, site not specified: Secondary | ICD-10-CM | POA: Diagnosis not present

## 2024-02-16 DIAGNOSIS — E039 Hypothyroidism, unspecified: Secondary | ICD-10-CM

## 2024-02-16 DIAGNOSIS — F419 Anxiety disorder, unspecified: Secondary | ICD-10-CM

## 2024-02-16 DIAGNOSIS — I10 Essential (primary) hypertension: Secondary | ICD-10-CM
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
## 2024-02-20 LAB — BASIC METABOLIC PANEL WITH GFR
BUN: 13 (ref 4–21)
CO2: 27 — AB (ref 13–22)
Chloride: 104 (ref 99–108)
Creatinine: 0.8 (ref 0.5–1.1)
Glucose: 96
Potassium: 4.1 meq/L (ref 3.5–5.1)
Sodium: 140 (ref 137–147)

## 2024-02-20 LAB — CBC: RBC: 4.36 (ref 3.87–5.11)

## 2024-02-20 LAB — CBC AND DIFFERENTIAL
HCT: 39 (ref 36–46)
Hemoglobin: 13.2 (ref 12.0–16.0)
Neutrophils Absolute: 2953
Platelets: 152 K/uL (ref 150–400)
WBC: 5.7

## 2024-02-20 LAB — TSH: TSH: 4.38 (ref 0.41–5.90)

## 2024-02-20 LAB — COMPREHENSIVE METABOLIC PANEL WITH GFR: Calcium: 8.7 (ref 8.7–10.7)

## 2024-02-21 ENCOUNTER — Encounter: Payer: Self-pay | Admitting: Sports Medicine

## 2024-02-26 ENCOUNTER — Encounter: Payer: Self-pay | Admitting: Nurse Practitioner

## 2024-02-26 ENCOUNTER — Non-Acute Institutional Stay: Payer: Self-pay | Admitting: Nurse Practitioner

## 2024-02-26 DIAGNOSIS — K219 Gastro-esophageal reflux disease without esophagitis: Secondary | ICD-10-CM

## 2024-02-26 DIAGNOSIS — F09 Unspecified mental disorder due to known physiological condition: Secondary | ICD-10-CM

## 2024-02-26 DIAGNOSIS — E039 Hypothyroidism, unspecified: Secondary | ICD-10-CM

## 2024-02-26 DIAGNOSIS — I10 Essential (primary) hypertension: Secondary | ICD-10-CM | POA: Diagnosis not present

## 2024-02-26 DIAGNOSIS — F419 Anxiety disorder, unspecified: Secondary | ICD-10-CM

## 2024-02-26 DIAGNOSIS — R42 Dizziness and giddiness: Secondary | ICD-10-CM

## 2024-02-26 DIAGNOSIS — K5901 Slow transit constipation: Secondary | ICD-10-CM

## 2024-02-26 DIAGNOSIS — R269 Unspecified abnormalities of gait and mobility: Secondary | ICD-10-CM | POA: Diagnosis not present

## 2024-02-26 DIAGNOSIS — C50212 Malignant neoplasm of upper-inner quadrant of left female breast: Secondary | ICD-10-CM

## 2024-02-26 DIAGNOSIS — Z170 Estrogen receptor positive status [ER+]: Secondary | ICD-10-CM

## 2024-02-26 NOTE — Assessment & Plan Note (Signed)
 stable, taking Lexapro,  Prn Alprazolam  available to her. TSH 4.38 02/20/24

## 2024-02-26 NOTE — Assessment & Plan Note (Signed)
 resides in [facility] for supportive care,  started Donepezil 07/14/22 by Dr. Hazeline Lister. Desires full code, also taking ASA

## 2024-02-26 NOTE — Assessment & Plan Note (Signed)
 furniture walking sometimes, high risk of falling 2/2 increased frailty and lack of safety awareness.

## 2024-02-26 NOTE — Assessment & Plan Note (Signed)
 takes Levothyroxine , TSH 4.38 02/20/24

## 2024-02-26 NOTE — Assessment & Plan Note (Signed)
Stable, taking MiraLax 
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
## 2024-02-26 NOTE — Assessment & Plan Note (Signed)
 Blood pressure is controlled, takes Losartan , Clonidine prn, ASA, Bun/creat 13/0.76 02/20/24

## 2024-02-26 NOTE — Assessment & Plan Note (Signed)
06/30/22 CT head, No acute intracranial abnormality. Age-related atrophy and chronic microvascular ischemic change. Prn Meclizine available to her.

## 2024-02-26 NOTE — Assessment & Plan Note (Signed)
 Stable, continue Esomeprazole, Hgb 13.2 02/20/24

## 2024-02-26 NOTE — Assessment & Plan Note (Signed)
 S/p L breast cancer lumpectomy/radioactive seed, takes Arimidex since 2021, f/u oncology

## 2024-03-19 ENCOUNTER — Encounter: Payer: Self-pay | Admitting: Nurse Practitioner

## 2024-03-19 ENCOUNTER — Non-Acute Institutional Stay: Payer: Self-pay | Admitting: Nurse Practitioner

## 2024-03-19 DIAGNOSIS — Z0000 Encounter for general adult medical examination without abnormal findings: Secondary | ICD-10-CM | POA: Diagnosis not present
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
## 2024-03-22 ENCOUNTER — Other Ambulatory Visit (HOSPITAL_BASED_OUTPATIENT_CLINIC_OR_DEPARTMENT_OTHER): Payer: Self-pay | Admitting: Nurse Practitioner

## 2024-03-22 DIAGNOSIS — Z780 Asymptomatic menopausal state: Secondary | ICD-10-CM

## 2024-03-27 ENCOUNTER — Encounter: Payer: Self-pay | Admitting: Nurse Practitioner

## 2024-05-03 ENCOUNTER — Other Ambulatory Visit: Payer: Self-pay

## 2024-05-06 ENCOUNTER — Encounter: Payer: Self-pay | Admitting: Sports Medicine

## 2024-05-06 ENCOUNTER — Non-Acute Institutional Stay: Admitting: Sports Medicine

## 2024-05-06 DIAGNOSIS — M159 Polyosteoarthritis, unspecified: Secondary | ICD-10-CM

## 2024-05-06 DIAGNOSIS — F0390 Unspecified dementia without behavioral disturbance: Secondary | ICD-10-CM

## 2024-05-06 DIAGNOSIS — F3289 Other specified depressive episodes: Secondary | ICD-10-CM

## 2024-05-06 DIAGNOSIS — C50919 Malignant neoplasm of unspecified site of unspecified female breast: Secondary | ICD-10-CM | POA: Diagnosis not present

## 2024-05-06 DIAGNOSIS — I10 Essential (primary) hypertension: Secondary | ICD-10-CM

## 2024-05-06 DIAGNOSIS — E559 Vitamin D deficiency, unspecified: Secondary | ICD-10-CM
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
## 2024-05-07 ENCOUNTER — Telehealth: Payer: Self-pay | Admitting: Nurse Practitioner

## 2024-05-07 NOTE — Telephone Encounter (Signed)
 Received a call from a nurse to schedule, but there is no referral I sent a message to [facility] who is handling our oncology referrals.

## 2024-05-31 ENCOUNTER — Inpatient Hospital Stay: Attending: Hematology and Oncology | Admitting: Hematology and Oncology

## 2024-05-31 ENCOUNTER — Inpatient Hospital Stay

## 2024-05-31 VITALS — BP 140/68 | HR 76 | Temp 97.6°F | Resp 13 | Wt 175.6 lb

## 2024-05-31 DIAGNOSIS — Z923 Personal history of irradiation: Secondary | ICD-10-CM | POA: Insufficient documentation

## 2024-05-31 DIAGNOSIS — C50212 Malignant neoplasm of upper-inner quadrant of left female breast: Secondary | ICD-10-CM | POA: Insufficient documentation

## 2024-05-31 DIAGNOSIS — F419 Anxiety disorder, unspecified: Secondary | ICD-10-CM | POA: Insufficient documentation

## 2024-05-31 DIAGNOSIS — Z1721 Progesterone receptor positive status: Secondary | ICD-10-CM | POA: Insufficient documentation

## 2024-05-31 DIAGNOSIS — R413 Other amnesia: Secondary | ICD-10-CM | POA: Insufficient documentation

## 2024-05-31 DIAGNOSIS — Z90710 Acquired absence of both cervix and uterus: Secondary | ICD-10-CM | POA: Diagnosis not present

## 2024-05-31 DIAGNOSIS — Z7982 Long term (current) use of aspirin: Secondary | ICD-10-CM | POA: Insufficient documentation

## 2024-05-31 DIAGNOSIS — M8580 Other specified disorders of bone density and structure, unspecified site: Secondary | ICD-10-CM | POA: Diagnosis not present

## 2024-05-31 DIAGNOSIS — Z79811 Long term (current) use of aromatase inhibitors: Secondary | ICD-10-CM | POA: Insufficient documentation

## 2024-05-31 DIAGNOSIS — Z170 Estrogen receptor positive status [ER+]: Secondary | ICD-10-CM | POA: Insufficient documentation

## 2024-05-31 DIAGNOSIS — Z79899 Other long term (current) drug therapy: Secondary | ICD-10-CM | POA: Insufficient documentation

## 2024-05-31 DIAGNOSIS — Z1731 Human epidermal growth factor receptor 2 positive status: Secondary | ICD-10-CM | POA: Diagnosis not present

## 2024-05-31 DIAGNOSIS — Z79890 Hormone replacement therapy (postmenopausal): Secondary | ICD-10-CM | POA: Diagnosis not present

## 2024-05-31 DIAGNOSIS — Z87891 Personal history of nicotine dependence: Secondary | ICD-10-CM | POA: Insufficient documentation

## 2024-05-31 DIAGNOSIS — I10 Essential (primary) hypertension: Secondary | ICD-10-CM | POA: Diagnosis not present

## 2024-05-31 NOTE — Assessment & Plan Note (Signed)
 Assessment and Plan Assessment & Plan Left breast cancer, status post surgery and radiation, on adjuvant anastrozole  Left breast cancer treated with surgery and radiation. On anastrozole  with no signs of recurrence. Prioritized quality of life over aggressive surveillance due to age and memory issues. - Continue anastrozole  for one more year, then discontinue. - No further mammograms recommended - Perform breast exams every six months with her PCP  Memory changes, followed by primary team [facility]. She had no memory of her cancer and asked me the same question a few times during the conversation. At this point, her other co morbidities which are non cancer related are likely going to determine her life expectancy. There was no one with her to explain this to, and I dont believe she will remember most of our conversation.
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
## 2024-07-05 ENCOUNTER — Encounter: Payer: Self-pay | Admitting: Sports Medicine

## 2024-07-05 NOTE — Progress Notes (Signed)
 This encounter was created in error - please disregard.

## 2024-07-09 ENCOUNTER — Non-Acute Institutional Stay: Admitting: Nurse Practitioner

## 2024-07-09 ENCOUNTER — Encounter: Payer: Self-pay | Admitting: Nurse Practitioner

## 2024-07-09 DIAGNOSIS — F09 Unspecified mental disorder due to known physiological condition: Secondary | ICD-10-CM | POA: Diagnosis not present

## 2024-07-09 DIAGNOSIS — E039 Hypothyroidism, unspecified: Secondary | ICD-10-CM | POA: Diagnosis not present

## 2024-07-09 DIAGNOSIS — I10 Essential (primary) hypertension: Secondary | ICD-10-CM | POA: Diagnosis not present

## 2024-07-09 DIAGNOSIS — R269 Unspecified abnormalities of gait and mobility: Secondary | ICD-10-CM | POA: Diagnosis not present

## 2024-07-09 DIAGNOSIS — Z170 Estrogen receptor positive status [ER+]: Secondary | ICD-10-CM

## 2024-07-09 DIAGNOSIS — C50212 Malignant neoplasm of upper-inner quadrant of left female breast: Secondary | ICD-10-CM

## 2024-07-09 DIAGNOSIS — F419 Anxiety disorder, unspecified: Secondary | ICD-10-CM

## 2024-07-09 DIAGNOSIS — R42 Dizziness and giddiness: Secondary | ICD-10-CM

## 2024-07-09 DIAGNOSIS — K5901 Slow transit constipation: Secondary | ICD-10-CM

## 2024-07-09 DIAGNOSIS — K219 Gastro-esophageal reflux disease without esophagitis: Secondary | ICD-10-CM

## 2024-07-09 NOTE — Assessment & Plan Note (Signed)
 takes Levothyroxine , TSH 4.38 02/20/24

## 2024-07-09 NOTE — Assessment & Plan Note (Signed)
 stable, taking Lexapro, hx of Alprazolam  use,  TSH 4.38 02/20/24

## 2024-07-09 NOTE — Assessment & Plan Note (Signed)
 S/p L breast cancer lumpectomy/radioactive seed, takes Arimidex since 2021, f/u oncology
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
## 2024-07-09 NOTE — Assessment & Plan Note (Addendum)
 Occasionally elevated systolic blood pressure in the 150s to 160s, takes Losartan , Clonidine prn, ASA, Bun/creat 13/0.76 02/20/24

## 2024-07-09 NOTE — Assessment & Plan Note (Signed)
 Vertigo, 06/30/22 CT head, No acute intracranial abnormality. Age-related atrophy and chronic microvascular ischemic change. stable.

## 2024-07-09 NOTE — Assessment & Plan Note (Signed)
 furniture walking sometimes, high risk of falling 2/2 increased frailty and lack of safety awareness.

## 2024-07-09 NOTE — Assessment & Plan Note (Signed)
Stable, taking MiraLax 

## 2024-07-09 NOTE — Assessment & Plan Note (Signed)
 Stable, Esomeprazole, Hgb 13.2 02/20/24

## 2024-07-09 NOTE — Assessment & Plan Note (Signed)
 resides in [facility] for supportive care,  started Donepezil 07/14/22 by Dr. Hazeline Lister. Desires full code, also taking ASA

## 2024-08-01 ENCOUNTER — Other Ambulatory Visit: Payer: Self-pay

## 2024-08-01 ENCOUNTER — Encounter (HOSPITAL_COMMUNITY): Payer: Self-pay

## 2024-08-01 ENCOUNTER — Emergency Department (HOSPITAL_COMMUNITY)
Admission: EM | Admit: 2024-08-01 | Discharge: 2024-08-02 | Disposition: A | Discharge status: Home / Self Care | Attending: Emergency Medicine | Admitting: Emergency Medicine

## 2024-08-01 DIAGNOSIS — H1031 Unspecified acute conjunctivitis, right eye: Secondary | ICD-10-CM | POA: Diagnosis not present

## 2024-08-01 DIAGNOSIS — F0390 Unspecified dementia without behavioral disturbance: Secondary | ICD-10-CM | POA: Diagnosis not present

## 2024-08-01 DIAGNOSIS — H5711 Ocular pain, right eye: Secondary | ICD-10-CM | POA: Diagnosis present

## 2024-08-01 DIAGNOSIS — Z7982 Long term (current) use of aspirin: Secondary | ICD-10-CM | POA: Diagnosis not present

## 2024-08-01 LAB — OPHTHALMOLOGY REPORT-SCANNED

## 2024-08-01 MED ORDER — ERYTHROMYCIN 5 MG/GM OP OINT: TOPICAL_OINTMENT | OPHTHALMIC | 0 refills | Status: DC

## 2024-08-01 MED ORDER — MOXIFLOXACIN HCL 0.5 % OP SOLN: 1.0000 [drp] | Freq: Four times a day (QID) | OPHTHALMIC | 0 refills | Status: DC

## 2024-08-01 MED ORDER — TETRACAINE HCL 0.5 % OP SOLN
2.0000 [drp] | Freq: Once | OPHTHALMIC | Status: AC
  Administered 2024-08-01: 2 [drp] via OPHTHALMIC
  Filled 2024-08-01: qty 4

## 2024-08-01 MED ORDER — FLUORESCEIN SODIUM 1 MG OP STRP
1.0000 | ORAL_STRIP | Freq: Once | OPHTHALMIC | Status: AC
  Administered 2024-08-01: 1 via OPHTHALMIC
  Filled 2024-08-01: qty 1

## 2024-08-01 NOTE — ED Triage Notes (Addendum)
 Pt was BIB [facility] [place] with complaints of eye pain/ irritation. She was also extremely hypertensive in the field at 200s palpated. Ophthalmologist coming to [facility] per EMS. Demented at baseline. She recently had injections put into her eyes and is having a reaction to it in one eye.

## 2024-08-01 NOTE — Consult Note (Signed)
 Ophthalmology Consult Note   Subjective: Patient who has wet macular degeneration OD who had her first injection in the right eye today. Few hours after injection patient reported discomfort with discharge. Her living facility called about her symptoms. Was instructed to go to the ED for exam given risk of endophthalmitis. Patient is a poor historian and disoriented.  Objective: Vital signs in last 24 hours:   Weight change:     Intake/Output from previous day: No intake/output data recorded. Intake/Output this shift: No intake/output data recorded.  Base Eye Exam  Visual Acuity (ETDRS)   Right Left  Dist [place] CF 20/40  Near cc  20/400    Tonometry (Tonopen)   Right Left  Pressure 14 14   Pupils   APD  Right APD  Left      Extraocular Movement   Right Left   Full Full   Neuro/Psych  Oriented x3: Yes  Mood/Affect: Normal         Slit Lamp and Fundus Exam   External Exam   Right Left  External Ptosis Ptosis   Slit Lamp Exam   Right Left  Lids/Lashes erythema Normal  Conjunctiva/Sclera Tr-1+ vessels White and quiet  Cornea Clear Clear  Anterior Chamber Deep and quiet, no signs of hypopyon Deep and quiet  Iris Grossly normal Grossly normal  Lens PCIOL        Fundus Exam   Right Clear  Posterior Vitreous Clear, no signs of vitritis   Disc Pink, sharp margins   C/D Ratio 0.1   Macula Large, elevated disciform scar, drusen   Vessels Normal course and caliber   Periphery Attached      No results for input(s): WBC, HGB, HCT, NA, K, CL, CO2, BUN, CREATININE, GLU in the last 72 hours.  Invalid input(s): PLATELETS  Studies/Results: No results found.  Medications: I have reviewed the patient's current medications.  Assessment/Plan:  Bacterial conjunctivitis, RIGHT eye Ddx: Allergic reaction to injection, RIGHT eye  -Patient has mild purulent discharge on initial exam. Discharge cleared up by the time DFE was  performed. Povidine swab to upper and lower eyelid then washed out at bedside -Recommend starting Moxifloxacin QID OD, Erythromycin ointment TID OD. -Patient is contagious until redness and discharge clears. -Can follow up with Dr. Fleeta at [facility] on Monday, sooner if symptoms worsen including VA changes, redness, worsening discharge. -Will notify Dr. Elner of new events.  Follow up with Dr. Zerita Fleeta at [facility] Address: [address] [place], [place] [address] Phone: [phone]. Fax: [phone]    LOS: 0 days   Kandi Brusseau T Amire Leazer 08/01/2024

## 2024-08-01 NOTE — Discharge Instructions (Signed)
 Our ophthalmologist saw you here in the emergency department and agrees with my evaluation that you have a conjunctivitis.  This is a superficial infection of the eye.  This is treated with eyedrops.  Please follow the instructions and follow-up with your ophthalmologist.

## 2024-08-02 NOTE — ED Provider Notes (Signed)
 [place] EMERGENCY DEPARTMENT AT [facility] Provider Note   CSN: [id number] Arrival date & time: 08/01/24  2119     Patient presents with: Eye Pain and Hypertension   Michelle [name] is a 89 y.o. female.    Eye Pain  Hypertension  Patient is a 89 year old female with a past medical history significant for wet macular degeneration of the right eye had a eye injection earlier today and several hours later reported some discomfort and discharge.  She has a history of dementia.  Is a somewhat poor historian.  She states that pain is mild and uncomfortable.     Prior to Admission medications   Medication Sig Start Date End Date Taking? Authorizing Provider  erythromycin ophthalmic ointment Place a 1/2 inch ribbon of ointment into the lower eyelid. Three times daily 08/01/24  Yes Kashus Karlen, [place] S, PA  moxifloxacin (VIGAMOX) 0.5 % ophthalmic solution Place 1 drop into the right eye in the morning, at noon, in the evening, and at bedtime. 08/01/24  Yes Nirel Babler S, PA  acetaminophen  (TYLENOL ) 325 MG tablet Take 650 mg by mouth every 6 (six) hours as needed.    [provider]  anastrozole  (ARIMIDEX ) 1 MG tablet TAKE 1 TABLET BY MOUTH EVERY DAY 02/24/21   Magrinat, Sandria BROCKS, MD  aspirin 81 MG tablet Take 81 mg by mouth at bedtime.     [provider]  Cholecalciferol (VITAMIN D ) 50 MCG (2000 UT) CAPS Take 1 capsule by mouth daily.    [provider]  cloNIDine (CATAPRES) 0.1 MG tablet Take 0.1 mg by mouth every 8 (eight) hours as needed.    [provider]  donepezil (ARICEPT) 5 MG tablet Take 5 mg by mouth at bedtime.    [provider]  escitalopram (LEXAPRO) 5 MG tablet Take 5 mg by mouth daily.    [provider]  esomeprazole (NEXIUM) 20 MG capsule Take 20 mg by mouth daily.    [provider]  levothyroxine  (SYNTHROID ) 88 MCG tablet Take 1 tablet (88 mcg total) by mouth daily. 06/21/23   Medina-Vargas,  Monina C, NP  losartan  (COZAAR ) 100 MG tablet Take 100 mg by mouth daily.    [provider]  Multiple Vitamin (THEREMS PO) Take by mouth daily.    [provider]  Multiple Vitamins-Minerals (PRESERVISION AREDS 2) CAPS Take 1 capsule by mouth 2 (two) times daily.    [provider]  polyethylene glycol (MIRALAX / GLYCOLAX) 17 g packet Take 17 g by mouth daily.    [provider]    Allergies: Atorvastatin and Pravastatin    Review of Systems  Eyes:  Positive for pain.    Updated Vital Signs BP (!) 152/69   Pulse 85   Temp 97.8 F (36.6 C) (Oral)   Resp 18   Ht 5' 6 (1.676 m)   SpO2 98%   BMI 28.15 kg/m   Physical Exam Vitals and nursing note reviewed.  Constitutional:      General: She is not in acute distress.    Appearance: Normal appearance. She is not ill-appearing.  HENT:     Head: Normocephalic and atraumatic.  Eyes:     General: No scleral icterus.       Right eye: No discharge.        Left eye: No discharge.     Conjunctiva/sclera: Conjunctivae normal.     Comments: Right ocular conjunctivitis  Pulmonary:     Effort: Pulmonary effort  is normal.     Breath sounds: No stridor.  Neurological:     Mental Status: She is alert and oriented to person, place, and time. Mental status is at baseline.     (all labs ordered are listed, but only abnormal results are displayed) Labs Reviewed - No data to display  EKG: None  Radiology: No results found.   Procedures   Medications Ordered in the ED  tetracaine (PONTOCAINE) 0.5 % ophthalmic solution 2 drop (2 drops Right Eye Given by Other 08/01/24 2344)  fluorescein  ophthalmic strip 1 strip (1 strip Right Eye Given 08/01/24 2345)                                    Medical Decision Making Risk Prescription drug management.   Patient is a 89 year old female with a past medical history significant for wet macular degeneration of the right eye had a eye injection earlier  today and several hours later reported some discomfort and discharge.  She has a history of dementia.  Is a somewhat poor historian.  She states that pain is mild and uncomfortable.  Conveniently, ophthalmology was present in department and was able to evaluate patient.  They agree with my assessment the patient has conjunctivitis.  Will discharge on moxifloxacin and erythromycin and recommend ophthalmology follow-up.   Final diagnoses:  Acute bacterial conjunctivitis of right eye    ED Discharge Orders          Ordered    moxifloxacin (VIGAMOX) 0.5 % ophthalmic solution  4 times daily        08/01/24 2347    erythromycin ophthalmic ointment        08/01/24 2347               Neldon Hamp RAMAN, PA 08/02/24 0015    Mannie Pac T, DO 08/02/24 1621

## 2024-08-27 ENCOUNTER — Encounter: Payer: Self-pay | Admitting: Nurse Practitioner

## 2024-08-27 ENCOUNTER — Non-Acute Institutional Stay: Admitting: Nurse Practitioner

## 2024-08-27 DIAGNOSIS — E039 Hypothyroidism, unspecified: Secondary | ICD-10-CM | POA: Diagnosis not present

## 2024-08-27 DIAGNOSIS — F09 Unspecified mental disorder due to known physiological condition: Secondary | ICD-10-CM | POA: Diagnosis not present

## 2024-08-27 DIAGNOSIS — R269 Unspecified abnormalities of gait and mobility: Secondary | ICD-10-CM

## 2024-08-27 DIAGNOSIS — I10 Essential (primary) hypertension: Secondary | ICD-10-CM | POA: Diagnosis not present

## 2024-08-27 DIAGNOSIS — C50212 Malignant neoplasm of upper-inner quadrant of left female breast: Secondary | ICD-10-CM

## 2024-08-27 DIAGNOSIS — F419 Anxiety disorder, unspecified: Secondary | ICD-10-CM

## 2024-08-27 DIAGNOSIS — K219 Gastro-esophageal reflux disease without esophagitis: Secondary | ICD-10-CM

## 2024-08-27 DIAGNOSIS — K5901 Slow transit constipation: Secondary | ICD-10-CM

## 2024-08-27 DIAGNOSIS — Z170 Estrogen receptor positive status [ER+]: Secondary | ICD-10-CM

## 2024-08-27 DIAGNOSIS — R32 Unspecified urinary incontinence: Secondary | ICD-10-CM | POA: Insufficient documentation

## 2024-08-27 NOTE — Assessment & Plan Note (Signed)
 TSH 4.38 02/20/2024, continue levothyroxine 

## 2024-08-27 NOTE — Assessment & Plan Note (Signed)
 Status post breast cancer lumpectomy/radioactive seed, continue Arimidex  since 2021

## 2024-08-27 NOTE — Assessment & Plan Note (Signed)
 Esomeprazole, Hgb 13.2 02/20/24

## 2024-08-27 NOTE — Assessment & Plan Note (Signed)
 CBC/diff, CMP/eGFR, TSH, UA C/S
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
## 2024-08-27 NOTE — Assessment & Plan Note (Addendum)
 Blood pressure is controlled, continue losartan , ASA, as needed clonidine, BUN/creatinine 13/0.76 02/20/2024 Update CMP/eGFR, lipid panel.

## 2024-08-27 NOTE — Assessment & Plan Note (Signed)
Ambulates with walker.  

## 2024-08-27 NOTE — Assessment & Plan Note (Signed)
 stable, taking Lexapro, hx of Alprazolam  use,  TSH 4.38 02/20/24

## 2024-08-27 NOTE — Assessment & Plan Note (Signed)
Continue MiraLax.  

## 2024-08-27 NOTE — Assessment & Plan Note (Signed)
 No behavioral issues, continue ADL for supportive care, continue donepezil and ASA

## 2024-10-01 ENCOUNTER — Non-Acute Institutional Stay: Payer: Self-pay | Admitting: Nurse Practitioner

## 2024-10-01 ENCOUNTER — Encounter: Payer: Self-pay | Admitting: Nurse Practitioner

## 2024-10-01 DIAGNOSIS — E039 Hypothyroidism, unspecified: Secondary | ICD-10-CM

## 2024-10-01 DIAGNOSIS — R269 Unspecified abnormalities of gait and mobility: Secondary | ICD-10-CM

## 2024-10-01 DIAGNOSIS — I10 Essential (primary) hypertension: Secondary | ICD-10-CM

## 2024-10-01 DIAGNOSIS — F09 Unspecified mental disorder due to known physiological condition: Secondary | ICD-10-CM

## 2024-10-01 DIAGNOSIS — F419 Anxiety disorder, unspecified: Secondary | ICD-10-CM | POA: Diagnosis not present

## 2024-10-01 NOTE — Assessment & Plan Note (Signed)
"   Gait abnormality, furniture walking sometimes, high risk of falling 2/2 increased frailty and lack of safety awareness.  09/25/24 assisted fell when walking up steps on her trip  "

## 2024-10-01 NOTE — Assessment & Plan Note (Signed)
 Esomeprazole, Hgb 12.9 08/29/24

## 2024-10-01 NOTE — Assessment & Plan Note (Signed)
 stable, taking Lexapro, hx of Alprazolam  use,  TSH 4.38 02/20/24

## 2024-10-01 NOTE — Assessment & Plan Note (Signed)
 takes Levothyroxine , TSH 0.4 08/29/24

## 2024-10-01 NOTE — Assessment & Plan Note (Addendum)
 Blood pressure is controlled  takes Losartan, Clonidine prn, ASA

## 2024-10-01 NOTE — Assessment & Plan Note (Addendum)
 progression of confusion, intermittent irritabilities, looking for her dog so could feed it.   Pleasantly confused and stated she has no concerns or complaints upon my examination today.     Resides in [place] for supportive care,  started Donepezil 07/14/22 by Dr. Gerome. Desires full code, also taking ASA  Pending CBC/diff, CMP/eGFR, UA C/S  Update MMSE  10/01/24 Na 141, K 4.4, Bun 14, creat 1.03, wbc 5.2, Hgb 13.6, plt 159, neutrophils 56.9
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
## 2024-11-12 ENCOUNTER — Non-Acute Institutional Stay: Admitting: Family Medicine

## 2024-11-12 DIAGNOSIS — F09 Unspecified mental disorder due to known physiological condition: Secondary | ICD-10-CM

## 2024-11-12 DIAGNOSIS — K219 Gastro-esophageal reflux disease without esophagitis: Secondary | ICD-10-CM | POA: Diagnosis not present

## 2024-11-12 DIAGNOSIS — E039 Hypothyroidism, unspecified: Secondary | ICD-10-CM | POA: Diagnosis not present

## 2024-11-12 DIAGNOSIS — I10 Essential (primary) hypertension: Secondary | ICD-10-CM | POA: Diagnosis not present

## 2024-11-12 NOTE — Assessment & Plan Note (Addendum)
 On thyroid  supplement.  TSH was a little low which suggests she needs less thyroid  supplement plan to check TSH again in several weeks

## 2024-11-12 NOTE — Assessment & Plan Note (Addendum)
 Last MMSE indicated more significant dementia than that usually associated with mild disorder.  She is on Aricept.  She functions well in this environment.  No history of elopement.  Will plan to increase her Lexapro from 5 mg to 10 mg in hopes that that may increase her engagement and participation with other residents

## 2024-11-12 NOTE — Assessment & Plan Note (Addendum)
 Continue with PPI.  Asymptomatic
